# Patient Record
Sex: Female | Born: 1945 | Race: White | Hispanic: No | Marital: Married | Smoking: Never smoker
Health system: Southern US, Community
[De-identification: ages and names within clinical notes are randomized; demographics above are authoritative.]

## PROBLEM LIST (undated history)

## (undated) DIAGNOSIS — N952 Postmenopausal atrophic vaginitis: Secondary | ICD-10-CM

## (undated) DIAGNOSIS — M1990 Unspecified osteoarthritis, unspecified site: Secondary | ICD-10-CM

## (undated) DIAGNOSIS — K219 Gastro-esophageal reflux disease without esophagitis: Secondary | ICD-10-CM

## (undated) DIAGNOSIS — R059 Cough, unspecified: Secondary | ICD-10-CM

## (undated) DIAGNOSIS — R05 Cough: Secondary | ICD-10-CM

## (undated) DIAGNOSIS — K589 Irritable bowel syndrome without diarrhea: Secondary | ICD-10-CM

## (undated) DIAGNOSIS — J189 Pneumonia, unspecified organism: Secondary | ICD-10-CM

## (undated) DIAGNOSIS — K5730 Diverticulosis of large intestine without perforation or abscess without bleeding: Secondary | ICD-10-CM

## (undated) DIAGNOSIS — D649 Anemia, unspecified: Secondary | ICD-10-CM

## (undated) DIAGNOSIS — M8580 Other specified disorders of bone density and structure, unspecified site: Secondary | ICD-10-CM

## (undated) HISTORY — PX: COLONOSCOPY: SHX174

## (undated) HISTORY — DX: Irritable bowel syndrome without diarrhea: K58.9

## (undated) HISTORY — DX: Unspecified osteoarthritis, unspecified site: M19.90

## (undated) HISTORY — DX: Postmenopausal atrophic vaginitis: N95.2

## (undated) HISTORY — DX: Diverticulosis of large intestine without perforation or abscess without bleeding: K57.30

## (undated) HISTORY — DX: Gastro-esophageal reflux disease without esophagitis: K21.9

## (undated) HISTORY — DX: Pneumonia, unspecified organism: J18.9

## (undated) HISTORY — DX: Anemia, unspecified: D64.9

## (undated) HISTORY — DX: Cough: R05

## (undated) HISTORY — DX: Cough, unspecified: R05.9

## (undated) HISTORY — DX: Other specified disorders of bone density and structure, unspecified site: M85.80

---

## 1978-02-17 HISTORY — PX: TUBAL LIGATION: SHX77

## 1997-08-12 ENCOUNTER — Other Ambulatory Visit: Admission: RE | Admit: 1997-08-12 | Discharge: 1997-08-12 | Payer: Self-pay | Admitting: *Deleted

## 1998-09-12 ENCOUNTER — Other Ambulatory Visit: Admission: RE | Admit: 1998-09-12 | Discharge: 1998-09-12 | Payer: Self-pay | Admitting: *Deleted

## 1999-10-05 ENCOUNTER — Other Ambulatory Visit: Admission: RE | Admit: 1999-10-05 | Discharge: 1999-10-05 | Payer: Self-pay | Admitting: *Deleted

## 2000-10-10 ENCOUNTER — Other Ambulatory Visit: Admission: RE | Admit: 2000-10-10 | Discharge: 2000-10-10 | Payer: Self-pay | Admitting: *Deleted

## 2002-04-19 DIAGNOSIS — K5730 Diverticulosis of large intestine without perforation or abscess without bleeding: Secondary | ICD-10-CM

## 2002-04-19 HISTORY — DX: Diverticulosis of large intestine without perforation or abscess without bleeding: K57.30

## 2003-08-21 ENCOUNTER — Other Ambulatory Visit: Admission: RE | Admit: 2003-08-21 | Discharge: 2003-08-21 | Payer: Self-pay | Admitting: Obstetrics and Gynecology

## 2005-04-02 ENCOUNTER — Other Ambulatory Visit: Admission: RE | Admit: 2005-04-02 | Discharge: 2005-04-02 | Payer: Self-pay | Admitting: Internal Medicine

## 2009-10-22 ENCOUNTER — Encounter (INDEPENDENT_AMBULATORY_CARE_PROVIDER_SITE_OTHER): Payer: Self-pay | Admitting: *Deleted

## 2010-05-19 NOTE — Letter (Signed)
Summary: Colonoscopy Date Change Letter  [facility]  [address] [place], [place] [address]   Phone: [phone]  Fax: [phone]      October 22, 2009 MRN: [id number]   Janice Flynn [address] [place], [place]  [address]   Dear Ms. Kalbfleisch,   Previously you were recommended to have a repeat colonoscopy around this time. Your chart was recently reviewed by Dr. Hedwig Morton. Juanda Chance of [facility]. Follow up colonoscopy is now recommended in August 2014. This revised recommendation is based on current, nationally recognized guidelines for colorectal cancer screening and polyp surveillance. These guidelines are endorsed by the American Cancer Society, [employer] on Colorectal Cancer as well as numerous other major medical organizations.  Please understand that our recommendation assumes that you do not have any new symptoms such as bleeding, a change in bowel habits, anemia, or significant abdominal discomfort. If you do have any concerning GI symptoms or want to discuss the guideline recommendations, please call to arrange an office visit at your earliest convenience. Otherwise we will keep you in our reminder system and contact you 1-2 months prior to the date listed above to schedule your next colonoscopy.  Thank you,  Hedwig Morton. Juanda Chance, M.D.  [facility] [phone]

## 2010-07-06 ENCOUNTER — Ambulatory Visit (INDEPENDENT_AMBULATORY_CARE_PROVIDER_SITE_OTHER): Payer: PRIVATE HEALTH INSURANCE | Admitting: Internal Medicine

## 2010-07-06 DIAGNOSIS — M899 Disorder of bone, unspecified: Secondary | ICD-10-CM

## 2010-07-06 DIAGNOSIS — R350 Frequency of micturition: Secondary | ICD-10-CM

## 2010-07-06 DIAGNOSIS — J40 Bronchitis, not specified as acute or chronic: Secondary | ICD-10-CM

## 2010-07-06 DIAGNOSIS — M949 Disorder of cartilage, unspecified: Secondary | ICD-10-CM

## 2010-08-20 ENCOUNTER — Ambulatory Visit (INDEPENDENT_AMBULATORY_CARE_PROVIDER_SITE_OTHER): Payer: PRIVATE HEALTH INSURANCE | Admitting: Internal Medicine

## 2010-08-20 ENCOUNTER — Other Ambulatory Visit: Payer: Self-pay

## 2010-08-20 DIAGNOSIS — D386 Neoplasm of uncertain behavior of respiratory organ, unspecified: Secondary | ICD-10-CM

## 2010-08-20 DIAGNOSIS — K5732 Diverticulitis of large intestine without perforation or abscess without bleeding: Secondary | ICD-10-CM

## 2010-08-20 DIAGNOSIS — Z1272 Encounter for screening for malignant neoplasm of vagina: Secondary | ICD-10-CM

## 2010-08-20 DIAGNOSIS — M899 Disorder of bone, unspecified: Secondary | ICD-10-CM

## 2010-08-20 DIAGNOSIS — Z23 Encounter for immunization: Secondary | ICD-10-CM

## 2010-08-20 DIAGNOSIS — Z01419 Encounter for gynecological examination (general) (routine) without abnormal findings: Secondary | ICD-10-CM

## 2010-09-02 ENCOUNTER — Encounter: Payer: Self-pay | Admitting: Internal Medicine

## 2011-02-22 ENCOUNTER — Encounter: Payer: Self-pay | Admitting: Internal Medicine

## 2011-02-22 ENCOUNTER — Telehealth: Payer: Self-pay | Admitting: Internal Medicine

## 2011-02-22 DIAGNOSIS — M8580 Other specified disorders of bone density and structure, unspecified site: Secondary | ICD-10-CM | POA: Insufficient documentation

## 2011-02-22 NOTE — Telephone Encounter (Signed)
Please call pt an tell her that her bone scan shows early bone thinning - osteopenia.   Take calcium 1200-1500mg  and Vit D [phone] units

## 2011-02-22 NOTE — Telephone Encounter (Signed)
LMOVM for pt to return call to the office

## 2011-02-24 ENCOUNTER — Telehealth: Payer: Self-pay | Admitting: Emergency Medicine

## 2011-02-24 NOTE — Telephone Encounter (Signed)
Yes she should have flu and Tdap

## 2011-02-24 NOTE — Telephone Encounter (Signed)
Spoke with Janice Flynn this morning.  Answered questions regarding flu shot, she will come in for injection.  She is up to date on all other immunizations except pertussis.  She last had tetanus in 2005, but is uncertain about pertussis.  She does have small grandchildren ranging in age from 91-66 years old.  She would like to know if she needs to update herself on Tdap immunization and if so, can she do that while she is here for her flu shot?

## 2011-02-24 NOTE — Telephone Encounter (Signed)
Spoke with Janice Flynn, she is agreeable to flu and Tdap.  Appt scheduled for tomorrow at 1130am for nurse visit for immunizations

## 2011-02-24 NOTE — Telephone Encounter (Signed)
Spoke with Britta Mccreedy, she is aware of results and agreeable to calcium and vitamin D recommendations

## 2011-02-25 ENCOUNTER — Ambulatory Visit (INDEPENDENT_AMBULATORY_CARE_PROVIDER_SITE_OTHER): Payer: Medicare Other | Admitting: Emergency Medicine

## 2011-02-25 DIAGNOSIS — Z23 Encounter for immunization: Secondary | ICD-10-CM

## 2011-05-27 ENCOUNTER — Other Ambulatory Visit: Payer: Self-pay | Admitting: Dermatology

## 2011-09-30 ENCOUNTER — Encounter: Payer: Self-pay | Admitting: Internal Medicine

## 2011-09-30 ENCOUNTER — Encounter: Payer: Medicare Other | Admitting: Internal Medicine

## 2011-09-30 ENCOUNTER — Ambulatory Visit (INDEPENDENT_AMBULATORY_CARE_PROVIDER_SITE_OTHER): Payer: Medicare Other | Admitting: Internal Medicine

## 2011-09-30 VITALS — BP 128/76 | HR 96 | Temp 98.8°F | Resp 16 | Ht 63.0 in | Wt 136.0 lb

## 2011-09-30 DIAGNOSIS — M1990 Unspecified osteoarthritis, unspecified site: Secondary | ICD-10-CM | POA: Insufficient documentation

## 2011-09-30 DIAGNOSIS — J309 Allergic rhinitis, unspecified: Secondary | ICD-10-CM

## 2011-09-30 DIAGNOSIS — J310 Chronic rhinitis: Secondary | ICD-10-CM | POA: Insufficient documentation

## 2011-09-30 DIAGNOSIS — Z0000 Encounter for general adult medical examination without abnormal findings: Secondary | ICD-10-CM

## 2011-09-30 DIAGNOSIS — K5730 Diverticulosis of large intestine without perforation or abscess without bleeding: Secondary | ICD-10-CM

## 2011-09-30 DIAGNOSIS — G4700 Insomnia, unspecified: Secondary | ICD-10-CM | POA: Insufficient documentation

## 2011-09-30 DIAGNOSIS — K5790 Diverticulosis of intestine, part unspecified, without perforation or abscess without bleeding: Secondary | ICD-10-CM | POA: Insufficient documentation

## 2011-09-30 DIAGNOSIS — Z862 Personal history of diseases of the blood and blood-forming organs and certain disorders involving the immune mechanism: Secondary | ICD-10-CM

## 2011-09-30 DIAGNOSIS — M8580 Other specified disorders of bone density and structure, unspecified site: Secondary | ICD-10-CM

## 2011-09-30 DIAGNOSIS — M899 Disorder of bone, unspecified: Secondary | ICD-10-CM

## 2011-09-30 LAB — COMPREHENSIVE METABOLIC PANEL
CO2: 28 mEq/L (ref 19–32)
Creat: 0.85 mg/dL (ref 0.50–1.10)
Glucose, Bld: 100 mg/dL — ABNORMAL HIGH (ref 70–99)
Total Bilirubin: 0.4 mg/dL (ref 0.3–1.2)

## 2011-09-30 LAB — LIPID PANEL
Cholesterol: 169 mg/dL (ref 0–200)
Total CHOL/HDL Ratio: 3.4 Ratio
Triglycerides: 82 mg/dL (ref <150)
VLDL: 16 mg/dL (ref 0–40)

## 2011-09-30 LAB — CBC WITH DIFFERENTIAL/PLATELET
Eosinophils Absolute: 0 10*3/uL (ref 0.0–0.7)
Eosinophils Relative: 1 % (ref 0–5)
Lymphs Abs: 1.2 10*3/uL (ref 0.7–4.0)
MCH: 27.5 pg (ref 26.0–34.0)
MCHC: 34.4 g/dL (ref 30.0–36.0)
MCV: 80.1 fL (ref 78.0–100.0)
Monocytes Relative: 9 % (ref 3–12)
Platelets: 218 10*3/uL (ref 150–400)
RBC: 4.47 MIL/uL (ref 3.87–5.11)

## 2011-09-30 LAB — POCT URINALYSIS DIPSTICK
Blood, UA: NEGATIVE
Glucose, UA: NEGATIVE
Nitrite, UA: NEGATIVE
Protein, UA: NEGATIVE
Spec Grav, UA: 1.01
Urobilinogen, UA: 0.2

## 2011-09-30 MED ORDER — LORAZEPAM 0.5 MG PO TABS: ORAL_TABLET | ORAL | Status: DC

## 2011-09-30 NOTE — Patient Instructions (Addendum)
Labs will be mailed to you 

## 2011-09-30 NOTE — Progress Notes (Signed)
Subjective:    Patient ID: Janice Flynn, female    DOB: [birth year], 66 y.o.   MRN: [id number]  HPI  Janice Flynn is here for comprehensive eval. Overall doing well, but has insomnia.  Pt has had a lot of stress in life and mother died a few months ago, she is having reconstruction on house.  Falls asleep but wakes up each night.  Used Ambien in the past  She is due for colonsocopy next year and mammogram in the fall. UTD with vaccines  Allergies  Allergen Reactions  . Augmentin (Amoxicillin-Pot Clavulanate) Diarrhea   Past Medical History  Diagnosis Date  . Osteopenia   . Anemia   . Diverticulosis of colon 2004  . Allergic rhinitis   . Arthritis   . Atrophic vaginitis    Past Surgical History  Procedure Date  . Tubal ligation    History   Social History  . Marital Status: Married    Spouse Name: Linwood    Number of Children: N/A  . Years of Education: 80yr clg   Occupational History  . BOOK KEEPER    Social History Main Topics  . Smoking status: Never Smoker   . Smokeless tobacco: Never Used  . Alcohol Use: 1.0 oz/week    2 drink(s) per week     2-3 glasses of wine weekly  . Drug Use: No  . Sexually Active: Yes -- Female partner(s)   Other Topics Concern  . Not on file   Social History Narrative  . No narrative on file   Family History  Problem Relation Age of Onset  . COPD Father     deceased  . Alzheimer's disease Mother   . Osteoporosis Mother   . Ovarian cancer Maternal Grandmother   . Colon cancer Paternal Grandmother    Patient Active Problem List  Diagnosis  . Osteopenia  . History of anemia  . Diverticulosis  . Allergic rhinitis  . DJD (degenerative joint disease)  . Atrophic rhinitis  . Insomnia   Current Outpatient Prescriptions on File Prior to Visit  Medication Sig Dispense Refill  . calcium carbonate 200 MG capsule Take 250 mg by mouth as needed.        . Cholecalciferol (VITAMIN D) 1000 UNITS capsule Take 1,000 Units by mouth daily.         Marland Kitchen estradiol (ESTRACE) 0.1 MG/GM vaginal cream Place 2 g vaginally 2 (two) times a week.            Review of Systems  Constitutional: Negative.   HENT: Negative.   Eyes: Negative.   Respiratory: Negative.   Cardiovascular: Negative.   Gastrointestinal: Negative.   Genitourinary: Negative.   Musculoskeletal: Negative.   Skin: Negative.   Neurological: Negative.   Hematological: Negative.   Psychiatric/Behavioral: Positive for disturbed wake/sleep cycle.       Objective:   Physical Exam Physical Exam  Nursing note and vitals reviewed.  Constitutional: She is oriented to person, place, and time. She appears well-developed and well-nourished.  HENT:  Head: Normocephalic and atraumatic.  Right Ear: Tympanic membrane and ear canal normal. No drainage. Tympanic membrane is not injected and not erythematous.  Left Ear: Tympanic membrane and ear canal normal. No drainage. Tympanic membrane is not injected and not erythematous.  Nose: Nose normal. Right sinus exhibits no maxillary sinus tenderness and no frontal sinus tenderness. Left sinus exhibits no maxillary sinus tenderness and no frontal sinus tenderness.  Mouth/Throat: Oropharynx is clear and moist. No oral lesions. No  oropharyngeal exudate.  Eyes: Conjunctivae and EOM are normal. Pupils are equal, round, and reactive to light.  Neck: Normal range of motion. Neck supple. No JVD present. Carotid bruit is not present. No mass and no thyromegaly present.  Cardiovascular: Normal rate, regular rhythm, S1 normal, S2 normal and intact distal pulses. Exam reveals no gallop and no friction rub.  No murmur heard.  Pulses:  Carotid pulses are 2+ on the right side, and 2+ on the left side.  Dorsalis pedis pulses are 2+ on the right side, and 2+ on the left side.  No carotid bruit. No LE edema  Pulmonary/Chest: Breath sounds normal. She has no wheezes. She has no rales. She exhibits no tenderness. Breasts no discrete masses no nipple  discharge no axillary adenopathy bilaterally. Abdominal: Soft. Bowel sounds are normal. She exhibits no distension and no mass. There is no hepatosplenomegaly. There is no tenderness. There is no CVA tenderness. Pelvic  bimanua only no adnexal masses  Recatal no masses guaic neg Musculoskeletal: Normal range of motion.  No active synovitis to joints.  Lymphadenopathy:  She has no cervical adenopathy.  She has no axillary adenopathy.  Right: No inguinal and no supraclavicular adenopathy present.  Left: No inguinal and no supraclavicular adenopathy present.  Neurological: She is alert and oriented to person, place, and time. She has normal strength and normal reflexes. She displays no tremor. No cranial nerve deficit or sensory deficit. Coordination and gait normal.  Skin: Skin is warm and dry. No rash noted. No cyanosis. Nails show no clubbing.  Psychiatric: She has a normal mood and affect. Her speech is normal and behavior is normal. Cognition and memory are normal.           Assessment & Plan:

## 2011-10-04 ENCOUNTER — Telehealth: Payer: Self-pay | Admitting: *Deleted

## 2011-10-04 NOTE — Telephone Encounter (Signed)
Copy of Pt's labs mailed to her home address.

## 2012-03-06 ENCOUNTER — Ambulatory Visit (INDEPENDENT_AMBULATORY_CARE_PROVIDER_SITE_OTHER): Payer: Medicare Other | Admitting: *Deleted

## 2012-03-06 DIAGNOSIS — Z23 Encounter for immunization: Secondary | ICD-10-CM

## 2012-03-08 ENCOUNTER — Encounter: Payer: Self-pay | Admitting: *Deleted

## 2012-06-08 ENCOUNTER — Other Ambulatory Visit: Payer: Self-pay | Admitting: Dermatology

## 2012-11-10 ENCOUNTER — Encounter: Payer: Self-pay | Admitting: Internal Medicine

## 2012-11-14 ENCOUNTER — Ambulatory Visit (INDEPENDENT_AMBULATORY_CARE_PROVIDER_SITE_OTHER): Payer: Medicare Other | Admitting: Internal Medicine

## 2012-11-14 ENCOUNTER — Encounter: Payer: Self-pay | Admitting: Internal Medicine

## 2012-11-14 ENCOUNTER — Telehealth: Payer: Self-pay | Admitting: *Deleted

## 2012-11-14 ENCOUNTER — Ambulatory Visit (HOSPITAL_BASED_OUTPATIENT_CLINIC_OR_DEPARTMENT_OTHER)
Admission: RE | Admit: 2012-11-14 | Discharge: 2012-11-14 | Disposition: A | Discharge status: Home / Self Care | Payer: Medicare Other | Source: Ambulatory Visit | Attending: Internal Medicine | Admitting: Internal Medicine

## 2012-11-14 VITALS — BP 110/67 | HR 81 | Temp 97.5°F | Resp 18 | Wt 137.0 lb

## 2012-11-14 DIAGNOSIS — Z862 Personal history of diseases of the blood and blood-forming organs and certain disorders involving the immune mechanism: Secondary | ICD-10-CM

## 2012-11-14 DIAGNOSIS — K5730 Diverticulosis of large intestine without perforation or abscess without bleeding: Secondary | ICD-10-CM

## 2012-11-14 DIAGNOSIS — M8580 Other specified disorders of bone density and structure, unspecified site: Secondary | ICD-10-CM

## 2012-11-14 DIAGNOSIS — R059 Cough, unspecified: Secondary | ICD-10-CM

## 2012-11-14 DIAGNOSIS — Z0000 Encounter for general adult medical examination without abnormal findings: Secondary | ICD-10-CM

## 2012-11-14 DIAGNOSIS — N3941 Urge incontinence: Secondary | ICD-10-CM

## 2012-11-14 DIAGNOSIS — Z139 Encounter for screening, unspecified: Secondary | ICD-10-CM

## 2012-11-14 DIAGNOSIS — M899 Disorder of bone, unspecified: Secondary | ICD-10-CM

## 2012-11-14 DIAGNOSIS — K5790 Diverticulosis of intestine, part unspecified, without perforation or abscess without bleeding: Secondary | ICD-10-CM

## 2012-11-14 DIAGNOSIS — R05 Cough: Secondary | ICD-10-CM

## 2012-11-14 DIAGNOSIS — J309 Allergic rhinitis, unspecified: Secondary | ICD-10-CM

## 2012-11-14 LAB — CBC WITH DIFFERENTIAL/PLATELET
Basophils Absolute: 0 10*3/uL (ref 0.0–0.1)
Basophils Relative: 0 % (ref 0–1)
Eosinophils Relative: 1 % (ref 0–5)
HCT: 37.4 % (ref 36.0–46.0)
MCHC: 34.8 g/dL (ref 30.0–36.0)
MCV: 80.8 fL (ref 78.0–100.0)
Monocytes Absolute: 0.6 10*3/uL (ref 0.1–1.0)
RDW: 14.1 % (ref 11.5–15.5)

## 2012-11-14 LAB — COMPREHENSIVE METABOLIC PANEL
AST: 19 U/L (ref 0–37)
Alkaline Phosphatase: 79 U/L (ref 39–117)
BUN: 12 mg/dL (ref 6–23)
Calcium: 9.3 mg/dL (ref 8.4–10.5)
Chloride: 104 mEq/L (ref 96–112)
Creat: 0.85 mg/dL (ref 0.50–1.10)

## 2012-11-14 MED ORDER — PREDNISONE 20 MG PO TABS: ORAL_TABLET | ORAL | Status: DC

## 2012-11-14 MED ORDER — FLUTICASONE PROPIONATE 50 MCG/ACT NA SUSP: 2.0000 | Freq: Every day | NASAL | Status: DC

## 2012-11-14 MED ORDER — OMEPRAZOLE 20 MG PO CPDR: 20.0000 mg | DELAYED_RELEASE_CAPSULE | Freq: Every day | ORAL | Status: DC

## 2012-11-14 MED ORDER — LORAZEPAM 1 MG PO TABS: ORAL_TABLET | ORAL | Status: DC

## 2012-11-14 NOTE — Telephone Encounter (Signed)
Notified pt of chest xray and an appt made

## 2012-11-14 NOTE — Telephone Encounter (Signed)
Message copied by Mathews Robinsons on Tue Nov 14, 2012  2:49 PM ------      Message from: Raechel Chute D      Created: Tue Nov 14, 2012  2:38 PM       Call pt and let her know that her xray shows she may have pneumonia              Have her see me tomorrow I want to give her an antibiotic injection ------

## 2012-11-14 NOTE — Progress Notes (Signed)
Subjective:    Patient ID: Janice Flynn, female    DOB: 1946/01/05, 67 y.o.   MRN: [id number]  HPI  [name] is here for CPE  Cough   She reports chronic cough that has been present for several months no wheezing no chest pain no SOB.  She does have intermittant chest burning from " acid backing up"  She is not on a PPI.   Insomnia  She reports Ambien and Ativan has worked in the past  GERD  See above  No change in color of stool  She also reports urge incontinence  No dysuria    She is hesitant to start on meds.    Allergies  Allergen Reactions  . Augmentin (Amoxicillin-Pot Clavulanate) Diarrhea   Past Medical History  Diagnosis Date  . Osteopenia   . Anemia   . Diverticulosis of colon 2004  . Allergic rhinitis   . Arthritis   . Atrophic vaginitis    Past Surgical History  Procedure Laterality Date  . Tubal ligation     History   Social History  . Marital Status: Married    Spouse Name: Linwood    Number of Children: N/A  . Years of Education: 23yr clg   Occupational History  . BOOK KEEPER    Social History Main Topics  . Smoking status: Never Smoker   . Smokeless tobacco: Never Used  . Alcohol Use: 1.0 oz/week    2 drink(s) per week     Comment: 2-3 glasses of wine weekly  . Drug Use: No  . Sexually Active: Yes -- Female partner(s)   Other Topics Concern  . Not on file   Social History Narrative  . No narrative on file   Family History  Problem Relation Age of Onset  . COPD Father     deceased  . Alzheimer's disease Mother   . Osteoporosis Mother   . Ovarian cancer Maternal Grandmother   . Colon cancer Paternal Grandmother    Patient Active Problem List   Diagnosis Date Noted  . Urge incontinence 11/14/2012  . Cough 11/14/2012  . History of anemia 09/30/2011  . Diverticulosis 09/30/2011  . Allergic rhinitis 09/30/2011  . DJD (degenerative joint disease) 09/30/2011  . Atrophic rhinitis 09/30/2011  . Insomnia 09/30/2011  . Osteopenia  02/22/2011   Current Outpatient Prescriptions on File Prior to Visit  Medication Sig Dispense Refill  . Cholecalciferol (VITAMIN D) 1000 UNITS capsule Take 1,000 Units by mouth daily.        Marland Kitchen estradiol (ESTRACE) 0.1 MG/GM vaginal cream Place 2 g vaginally 2 (two) times a week.        . calcium carbonate 200 MG capsule Take 250 mg by mouth as needed.         No current facility-administered medications on file prior to visit.      Review of Systems  Respiratory: Positive for cough.   Genitourinary: Positive for urgency.  All other systems reviewed and are negative.       Objective:   Physical Exam Physical Exam  Nursing note and vitals reviewed.  Constitutional: She is oriented to person, place, and time. She appears well-developed and well-nourished.  HENT:  Head: Normocephalic and atraumatic.  Right Ear: Tympanic membrane and ear canal normal. No drainage. Tympanic membrane is not injected and not erythematous.  Left Ear: Tympanic membrane and ear canal normal. No drainage. Tympanic membrane is not injected and not erythematous.  Nose: Nose normal. Right sinus exhibits no maxillary sinus  tenderness and no frontal sinus tenderness. Left sinus exhibits no maxillary sinus tenderness and no frontal sinus tenderness.  Mouth/Throat: Oropharynx is clear and moist. No oral lesions. No oropharyngeal exudate.  Eyes: Conjunctivae and EOM are normal. Pupils are equal, round, and reactive to light.  Neck: Normal range of motion. Neck supple. No JVD present. Carotid bruit is not present. No mass and no thyromegaly present.  Cardiovascular: Normal rate, regular rhythm, S1 normal, S2 normal and intact distal pulses. Exam reveals no gallop and no friction rub.  No murmur heard.  Pulses:  Carotid pulses are 2+ on the right side, and 2+ on the left side.  Dorsalis pedis pulses are 2+ on the right side, and 2+ on the left side.  No carotid bruit. No LE edema  Pulmonary/Chest: Breath sounds  normal. She has no wheezes. She has no rales. She exhibits no tenderness.  Abdominal: Soft. Bowel sounds are normal. She exhibits no distension and no mass. There is no hepatosplenomegaly. There is no tenderness. There is no CVA tenderness.  Musculoskeletal: Normal range of motion.  No active synovitis to joints.  Lymphadenopathy:  She has no cervical adenopathy.  She has no axillary adenopathy.  Right: No inguinal and no supraclavicular adenopathy present.  Left: No inguinal and no supraclavicular adenopathy present.  Neurological: She is alert and oriented to person, place, and time. She has normal strength and normal reflexes. She displays no tremor. No cranial nerve deficit or sensory deficit. Coordination and gait normal.  Skin: Skin is warm and dry. No rash noted. No cyanosis. Nails show no clubbing.  Psychiatric: She has a normal mood and affect. Her speech is normal and behavior is normal. Cognition and memory are normal.           Assessment & Plan:  Health maintenance :  Due for MM and DExa in October [employer]. ADvised that she is due for her colonoscopy this year with Dr. Pernell Dupre today  Allergic rhinitis  flonase daily until I see her again  Cough  Will give short course of Prednisone 60 mg taper over 9 days.  Get CXR today  I do not hear bronchospasm on exam  GERD   Will start on omepraxole 20 mg daily  Urge incontinence:  She does not wish meds now,  I offered referral to pelvic floor PT but she wishes to think about this    Insomnia  Ok for Ativan 1 mg 2 times weekly hs  Ostoepenia  Calcium vitamin D  See me in 4-6 weeks

## 2012-11-14 NOTE — Patient Instructions (Addendum)
Take prednisone as directed  Use flonase 2 sprays in to each nostril daily  Take prilosec 20 mg daily  See me in 4-6 weeks

## 2012-11-15 ENCOUNTER — Ambulatory Visit (INDEPENDENT_AMBULATORY_CARE_PROVIDER_SITE_OTHER): Payer: Medicare Other | Admitting: Internal Medicine

## 2012-11-15 ENCOUNTER — Encounter: Payer: Self-pay | Admitting: Internal Medicine

## 2012-11-15 VITALS — BP 103/64 | HR 77 | Temp 98.0°F | Resp 18

## 2012-11-15 DIAGNOSIS — J189 Pneumonia, unspecified organism: Secondary | ICD-10-CM

## 2012-11-15 DIAGNOSIS — R059 Cough, unspecified: Secondary | ICD-10-CM

## 2012-11-15 DIAGNOSIS — R05 Cough: Secondary | ICD-10-CM

## 2012-11-15 MED ORDER — AZITHROMYCIN 250 MG PO TABS: ORAL_TABLET | ORAL | Status: DC

## 2012-11-15 MED ORDER — CEFTRIAXONE SODIUM 1 G IJ SOLR
1.0000 g | Freq: Once | INTRAMUSCULAR | Status: AC
  Administered 2012-11-15: 1 g via INTRAMUSCULAR

## 2012-11-15 NOTE — Patient Instructions (Addendum)
See me in 8 weeks  

## 2012-11-15 NOTE — Progress Notes (Signed)
Subjective:    Patient ID: Janice Flynn, female    DOB: [birth year], 67 y.o.   MRN: [id number]  HPI Janice Flynn is here for follow up.  See cxr  Indicative of air space disease and possible pneumonia.  Janice Flynn does state that she had [place] prior to her [place] trip and feels like she cannot get a deep breathe.  No fever cough occasionally productive of white sputum .   She does not like to take cough medicine but would like to take the prednisone  Allergies  Allergen Reactions  . Augmentin (Amoxicillin-Pot Clavulanate) Diarrhea   Past Medical History  Diagnosis Date  . Osteopenia   . Anemia   . Diverticulosis of colon 2004  . Allergic rhinitis   . Arthritis   . Atrophic vaginitis    Past Surgical History  Procedure Laterality Date  . Tubal ligation     History   Social History  . Marital Status: Married    Spouse Name: Linwood    Number of Children: N/A  . 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 Smoking status: Never Smoker   . Smokeless tobacco: Never Used  . Alcohol Use: 1.0 oz/week    2 drink(s) per week     Comment: 2-3 glasses of wine weekly  . Drug Use: No  . Sexually Active: Yes -- Female partner(s)   Other Topics Concern  . Not on file   Social History Narrative  . No narrative on file   Family History  Problem Relation Age of Onset  . COPD Father     deceased  . Alzheimer's disease Mother   . Osteoporosis Mother   . Ovarian cancer Maternal Grandmother   . Colon cancer Paternal Grandmother    Patient Active Problem List   Diagnosis Date Noted  . Pneumonia 11/15/2012  . Urge incontinence 11/14/2012  . Cough 11/14/2012  . History of anemia 09/30/2011  . Diverticulosis 09/30/2011  . Allergic rhinitis 09/30/2011  . DJD (degenerative joint disease) 09/30/2011  . Atrophic rhinitis 09/30/2011  . Insomnia 09/30/2011  . Osteopenia 02/22/2011   Current Outpatient Prescriptions on File  Prior to Visit  Medication Sig Dispense Refill  . calcium carbonate 200 MG capsule Take 250 mg by mouth as needed.        . Cholecalciferol (VITAMIN D) 1000 UNITS capsule Take 1,000 Units by mouth daily.        Marland Kitchen estradiol (ESTRACE) 0.1 MG/GM vaginal cream Place 2 g vaginally 2 (two) times a week.        . fluticasone (FLONASE) 50 MCG/ACT nasal spray Place 2 sprays into the nose daily.  16 g  0  . omeprazole (PRILOSEC) 20 MG capsule Take 1 capsule (20 mg total) by mouth daily.  90 capsule  0  . predniSONE (DELTASONE) 20 MG tablet Take 3 tablets for 2 days then 2 tablets for 3 days then 1 tablet for 3 days then stop  18 tablet  0  . LORazepam (ATIVAN) 1 MG tablet Take 1/2 tablet at hs 2 times weekly.  May repeat in 30 mins one time only  20 tablet  1   No current facility-administered medications on file prior to visit.       Review of Systems See HPI    Objective:   Physical Exam  Physical Exam  Nursing note and vitals reviewed.  Constitutional: She is oriented to person, place, and  time. She appears well-developed and well-nourished.  HENT:  Head: Normocephalic and atraumatic.  Cardiovascular: Normal rate and regular rhythm. Exam reveals no gallop and no friction rub.  No murmur heard.  Pulmonary/Chest: Breath sounds normal. She has no wheezes. She has no rales.  Neurological: She is alert and oriented to person, place, and time.  Skin: Skin is warm and dry.  Psychiatric: She has a normal mood and affect. Her behavior is normal.            Assessment & Plan:  RML pneumonia  Will give Rocephin 1gm in office today  And Z-pak  She does not want cough med oK to take prednisone  But will reduce dose to 40 mg for 2 days then 20 mg for 2 days then stop Advised to see me in 8 weeks and that she would need  Repeat CXR to assure clearing  Cough  See above

## 2012-11-15 NOTE — Addendum Note (Signed)
Addended by: Mathews Robinsons on: 11/15/2012 11:26 AM   Modules accepted: Orders

## 2012-11-16 ENCOUNTER — Encounter: Payer: Self-pay | Admitting: *Deleted

## 2012-12-19 ENCOUNTER — Ambulatory Visit: Payer: Medicare Other | Admitting: Internal Medicine

## 2012-12-20 ENCOUNTER — Encounter: Payer: Self-pay | Admitting: Internal Medicine

## 2013-01-09 ENCOUNTER — Ambulatory Visit (INDEPENDENT_AMBULATORY_CARE_PROVIDER_SITE_OTHER): Payer: Medicare Other | Admitting: Internal Medicine

## 2013-01-09 ENCOUNTER — Encounter: Payer: Self-pay | Admitting: Internal Medicine

## 2013-01-09 ENCOUNTER — Ambulatory Visit (HOSPITAL_BASED_OUTPATIENT_CLINIC_OR_DEPARTMENT_OTHER)
Admission: RE | Admit: 2013-01-09 | Discharge: 2013-01-09 | Disposition: A | Discharge status: Home / Self Care | Payer: Medicare Other | Source: Ambulatory Visit | Attending: Internal Medicine | Admitting: Internal Medicine

## 2013-01-09 VITALS — BP 117/72 | HR 82 | Temp 97.3°F | Resp 16

## 2013-01-09 DIAGNOSIS — Z23 Encounter for immunization: Secondary | ICD-10-CM

## 2013-01-09 DIAGNOSIS — K219 Gastro-esophageal reflux disease without esophagitis: Secondary | ICD-10-CM

## 2013-01-09 DIAGNOSIS — Z8701 Personal history of pneumonia (recurrent): Secondary | ICD-10-CM | POA: Insufficient documentation

## 2013-01-09 DIAGNOSIS — J189 Pneumonia, unspecified organism: Secondary | ICD-10-CM

## 2013-01-09 DIAGNOSIS — M899 Disorder of bone, unspecified: Secondary | ICD-10-CM

## 2013-01-09 DIAGNOSIS — Z862 Personal history of diseases of the blood and blood-forming organs and certain disorders involving the immune mechanism: Secondary | ICD-10-CM

## 2013-01-09 DIAGNOSIS — M8580 Other specified disorders of bone density and structure, unspecified site: Secondary | ICD-10-CM

## 2013-01-09 DIAGNOSIS — K5790 Diverticulosis of intestine, part unspecified, without perforation or abscess without bleeding: Secondary | ICD-10-CM

## 2013-01-09 DIAGNOSIS — K5730 Diverticulosis of large intestine without perforation or abscess without bleeding: Secondary | ICD-10-CM

## 2013-01-09 DIAGNOSIS — Z09 Encounter for follow-up examination after completed treatment for conditions other than malignant neoplasm: Secondary | ICD-10-CM | POA: Insufficient documentation

## 2013-01-09 DIAGNOSIS — J309 Allergic rhinitis, unspecified: Secondary | ICD-10-CM

## 2013-01-09 LAB — LIPID PANEL
HDL: 57 mg/dL (ref >39)
Triglycerides: 74 mg/dL (ref <150)

## 2013-01-09 MED ORDER — OMEPRAZOLE 20 MG PO CPDR: 20.0000 mg | DELAYED_RELEASE_CAPSULE | Freq: Every day | ORAL | Status: DC

## 2013-01-09 MED ORDER — OMEPRAZOLE 20 MG PO CPDR: DELAYED_RELEASE_CAPSULE | ORAL | Status: DC

## 2013-01-09 NOTE — Patient Instructions (Addendum)
CXR today    Call Dr.  Juanda Chance for your heartburn  See me as needed    Take 2 tablets of your Prilosec every day until you see Dr. Juanda Chance

## 2013-01-09 NOTE — Addendum Note (Signed)
Addended by: Mathews Robinsons on: 01/09/2013 02:55 PM   Modules accepted: Orders

## 2013-01-09 NOTE — Progress Notes (Signed)
Subjective:    Patient ID: Janice Flynn, female    DOB: 12-May-1945, 67 y.o.   MRN: [id number]  HPI Shanaya is here for follow up of presumed pneumonia.  CXR suggestive of early pnuemonia  No lingering cough  Doing well with exception of allergies  Flonase helping  She still continue to have heartburn type dyspepsia  On omeprazole 20 mg.  Diarrhea off and on no frank blood or dark stools.  She cannot identify any food offenders.  She is mostly on a lactose free diet  Allergies  Allergen Reactions  . Augmentin [Amoxicillin-Pot Clavulanate] Diarrhea   Past Medical History  Diagnosis Date  . Osteopenia   . Anemia   . Diverticulosis of colon 2004  . Allergic rhinitis   . Arthritis   . Atrophic vaginitis    Past Surgical History  Procedure Laterality Date  . Tubal ligation     History   Social History  . Marital Status: Married    Spouse Name: Linwood    Number of Children: N/A  . Years of Education: 85yr clg   Occupational History  . BOOK KEEPER    Social History Main Topics  . Smoking status: Never Smoker   . Smokeless tobacco: Never Used  . Alcohol Use: 1.0 oz/week    2 drink(s) per week     Comment: 2-3 glasses of wine weekly  . Drug Use: No  . Sexual Activity: Yes    Partners: Male   Other Topics Concern  . Not on file   Social History Narrative  . No narrative on file   Family History  Problem Relation Age of Onset  . COPD Father     deceased  . Alzheimer's disease Mother   . Osteoporosis Mother   . Ovarian cancer Maternal Grandmother   . Colon cancer Paternal Grandmother    Patient Active Problem List   Diagnosis Date Noted  . Pneumonia 11/15/2012  . Urge incontinence 11/14/2012  . Cough 11/14/2012  . History of anemia 09/30/2011  . Diverticulosis 09/30/2011  . Allergic rhinitis 09/30/2011  . DJD (degenerative joint disease) 09/30/2011  . Atrophic rhinitis 09/30/2011  . Insomnia 09/30/2011  . Osteopenia 02/22/2011   Current Outpatient  Prescriptions on File Prior to Visit  Medication Sig Dispense Refill  . azithromycin (ZITHROMAX Z-PAK) 250 MG tablet Take as directed  6 each  0  . calcium carbonate 200 MG capsule Take 250 mg by mouth as needed.        . Cholecalciferol (VITAMIN D) 1000 UNITS capsule Take 1,000 Units by mouth daily.        Marland Kitchen estradiol (ESTRACE) 0.1 MG/GM vaginal cream Place 2 g vaginally 2 (two) times a week.        . fluticasone (FLONASE) 50 MCG/ACT nasal spray Place 2 sprays into the nose daily.  16 g  0  . LORazepam (ATIVAN) 1 MG tablet Take 1/2 tablet at hs 2 times weekly.  May repeat in 30 mins one time only  20 tablet  1  . omeprazole (PRILOSEC) 20 MG capsule Take 1 capsule (20 mg total) by mouth daily.  90 capsule  0  . predniSONE (DELTASONE) 20 MG tablet Take 3 tablets for 2 days then 2 tablets for 3 days then 1 tablet for 3 days then stop  18 tablet  0   No current facility-administered medications on file prior to visit.       Review of Systems See HPI    Objective:  Physical Exam Physical Exam  Nursing note and vitals reviewed.  Constitutional: She is oriented to person, place, and time. She appears well-developed and well-nourished.  HENT:  Head: Normocephalic and atraumatic.  Cardiovascular: Normal rate and regular rhythm. Exam reveals no gallop and no friction rub.  No murmur heard.  Pulmonary/Chest: Breath sounds normal. She has no wheezes. She has no rales.  Neurological: She is alert and oriented to person, place, and time.  Skin: Skin is warm and dry.  Psychiatric: She has a normal mood and affect. Her behavior is normal.             Assessment & Plan:  Pneumonia  Will get follow up CXR  GERD/dyspepsia  Ok to take 40 mg of Prilosec.  I advised pt to make appt with her GI MD as she may need upper endoscopy  Pt voices understanding  Allergic rhinitis  continuye flonase OK to take Claritin plain  See me as needed  Flu vaccine today

## 2013-01-09 NOTE — Addendum Note (Signed)
Addended by: Mathews Robinsons on: 01/09/2013 10:21 AM   Modules accepted: Orders

## 2013-01-11 ENCOUNTER — Ambulatory Visit: Payer: Medicare Other | Admitting: Internal Medicine

## 2013-01-15 ENCOUNTER — Encounter: Payer: Self-pay | Admitting: *Deleted

## 2013-01-15 ENCOUNTER — Telehealth: Payer: Self-pay | Admitting: Internal Medicine

## 2013-01-15 DIAGNOSIS — R9389 Abnormal findings on diagnostic imaging of other specified body structures: Secondary | ICD-10-CM

## 2013-01-15 NOTE — Telephone Encounter (Signed)
Spoke with pt and informed of CXR results will schedule Ct of chest for comparison to 2007

## 2013-01-17 ENCOUNTER — Ambulatory Visit (HOSPITAL_BASED_OUTPATIENT_CLINIC_OR_DEPARTMENT_OTHER)
Admission: RE | Admit: 2013-01-17 | Discharge: 2013-01-17 | Disposition: A | Discharge status: Home / Self Care | Payer: Medicare Other | Source: Ambulatory Visit | Attending: Internal Medicine | Admitting: Internal Medicine

## 2013-01-17 DIAGNOSIS — R918 Other nonspecific abnormal finding of lung field: Secondary | ICD-10-CM | POA: Insufficient documentation

## 2013-01-17 DIAGNOSIS — R9389 Abnormal findings on diagnostic imaging of other specified body structures: Secondary | ICD-10-CM

## 2013-01-18 ENCOUNTER — Telehealth: Payer: Self-pay | Admitting: Internal Medicine

## 2013-01-18 DIAGNOSIS — R918 Other nonspecific abnormal finding of lung field: Secondary | ICD-10-CM

## 2013-01-18 NOTE — Telephone Encounter (Signed)
Spoke with pt and informed of CT results  I spoke with Dr.Young of [facility] and will schedule with their office for  further assessment  and possible bronchoscopy for confirmed diagnosis  Pt not acutely ill .  She is not coughing and will not place on antibiotics at this time

## 2013-01-19 ENCOUNTER — Encounter: Payer: Self-pay | Admitting: Internal Medicine

## 2013-01-19 ENCOUNTER — Ambulatory Visit (INDEPENDENT_AMBULATORY_CARE_PROVIDER_SITE_OTHER): Payer: Medicare Other | Admitting: Internal Medicine

## 2013-01-19 VITALS — BP 116/72 | HR 81 | Temp 98.0°F | Ht 63.25 in | Wt 140.0 lb

## 2013-01-19 DIAGNOSIS — R918 Other nonspecific abnormal finding of lung field: Secondary | ICD-10-CM

## 2013-01-19 MED ORDER — FAMOTIDINE 20 MG PO TABS: ORAL_TABLET | ORAL | Status: DC

## 2013-01-19 NOTE — Assessment & Plan Note (Signed)
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  Much better p rx with abx and prednisone and Although there are clearly abnormalities on CT scan, they should probably be considered "microscopic"  At this point and no need to change rx unless "macroscopic" changes evolve on cxr or her symptoms flare off pred and abx    The RML and Lingula are poorly "designed" and are frequently  Infected/re-infected  with organisms like MAI that are not necessarily significant pathogens and are extremely difficult to irradicate.  Best option here is just follow on a reflux regimen to prevent any acid injury to the airways or parenchyma and f/u with pft's and cxr 's

## 2013-01-19 NOTE — Progress Notes (Signed)
Subjective:    Patient ID: Janice Flynn, female    DOB: [birth year]  MRN: [id number]  HPI  24 yowf never smoker with ? RML/lingular syndrome in 2007 with dx of "pna" and completely better until dust exposure in 2012 and then recurrent daily cough since > cxr 11/14/12 rx prednisone  And zmax and all better but CT abn so referred 01/19/2013 to pulmonary clinic by Dr Dionisio David.  01/19/2013 1st [facility] office visit/ Zniyah Midkiff Chief Complaint  Patient presents with  . Pulmonary Consult    Referred per Dr. Raechel Chute for eval of abnormal ct chest. She reports has occ chest discomfort. She had been having SOB and cough for approx 3 years after being exposed to dust, but this has improved a lot.    Doe x steps x 2 years mild not getting any worse no assoc cp. occ  cp at rest sitting gone in 15 sec once or twice a week x 2 year  Aware of hb x 6-8 mon rx prilosec then 40 mg per day 30 min before eat bfast with occ episodes while sleeping - cough is minimal and dry but was more prod of discolored thick mucus until finished abx  No obvious day to day or daytime variabilty or assoc  chest tightness, subjective wheeze overt sinus    symptoms. No unusual exp hx or h/o childhood pna/ asthma or knowledge of premature birth.  Sleeping ok without nocturnal  or early am exacerbation  of respiratory  c/o's or need for noct saba. Also denies any obvious fluctuation of symptoms with weather or environmental changes or other aggravating or alleviating factors except as outlined above   Current Medications, Allergies, Complete Past Medical History, Past Surgical History, Family History, and Social History were reviewed in [employer] record.  ROS  The following are not active complaints unless bolded sore throat, dysphagia, dental problems, itching, sneezing,  nasal congestion or excess/ purulent secretions, ear ache,   fever, chills, sweats, unintended wt loss, pleuritic or  exertional cp, hemoptysis,  orthopnea pnd or leg swelling, presyncope, palpitations, heartburn, abdominal pain, anorexia, nausea, vomiting, diarrhea  or change in bowel or urinary habits, change in stools or urine, dysuria,hematuria,  rash, arthralgias, visual complaints, headache, numbness weakness or ataxia or problems with walking or coordination,  change in mood/affect or memory.         Review of Systems  Constitutional: Negative for fever, chills and unexpected weight change.  HENT: Negative for ear pain, nosebleeds, congestion, sore throat, rhinorrhea, sneezing, trouble swallowing, dental problem, voice change, postnasal drip and sinus pressure.   Eyes: Negative for visual disturbance.  Respiratory: Positive for cough and shortness of breath. Negative for choking.   Cardiovascular: Positive for chest pain. Negative for leg swelling.  Gastrointestinal: Negative for vomiting, abdominal pain and diarrhea.  Genitourinary: Negative for difficulty urinating.       Acid Heartburn Indigestion  Musculoskeletal: Negative for arthralgias.  Skin: Negative for rash.  Neurological: Negative for tremors, syncope and headaches.  Hematological: Does not bruise/bleed easily.       Objective:   Physical Exam   amb wf nad   Wt Readings from Last 3 Encounters:  01/19/13 140 lb (63.504 kg)  11/14/12 137 lb (62.143 kg)  09/30/11 136 lb (61.689 kg)      HEENT: nl dentition, turbinates, and orophanx. Nl external ear canals without cough reflex   NECK :  without JVD/Nodes/TM/ nl carotid upstrokes bilaterally   LUNGS: no acc  muscle use, clear to A and P bilaterally without cough on insp or exp maneuvers   CV:  RRR  no s3 or murmur or increase in P2, no edema   ABD:  soft and nontender with nl excursion in the supine position. 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     Assessment & Plan:

## 2013-01-19 NOTE — Patient Instructions (Addendum)
Continue prilosec but add Pepcid ac 20 mg at bedtime to get 24 hour acid suppression  GERD (REFLUX)  is an extremely common cause of respiratory symptoms just like yours , many times with no significant heartburn at all.    It can be treated with medication, but also with lifestyle changes including avoidance of late meals, excessive alcohol, smoking cessation, and avoid fatty foods, chocolate, peppermint, colas, red wine, and acidic juices such as orange juice.  NO MINT OR MENTHOL PRODUCTS SO NO COUGH DROPS  USE SUGARLESS CANDY INSTEAD (jolley ranchers or Stover's)  NO OIL BASED VITAMINS - use powdered substitutes.  No Calcium carbonate - use calcium gluconate instead  Please schedule a follow up office visit in 6 weeks, call sooner if needed with pft's on return

## 2013-01-23 ENCOUNTER — Other Ambulatory Visit: Payer: Self-pay | Admitting: *Deleted

## 2013-02-12 ENCOUNTER — Encounter: Payer: Self-pay | Admitting: *Deleted

## 2013-02-20 ENCOUNTER — Ambulatory Visit (AMBULATORY_SURGERY_CENTER): Payer: Self-pay

## 2013-02-20 VITALS — Ht 63.0 in | Wt 136.2 lb

## 2013-02-20 DIAGNOSIS — Z800 Family history of malignant neoplasm of digestive organs: Secondary | ICD-10-CM

## 2013-02-20 MED ORDER — MOVIPREP 100 G PO SOLR: ORAL | Status: DC

## 2013-02-20 NOTE — Progress Notes (Signed)
Pt came into the office today for her pre-visit prior to her colonosocpy with Dr Juanda Chance on 03/06/13. There was a warning taking Moviprep due to side effect with Augmentin. Pt states she does not remember being allergic to Augmentin.She will call our office if she has problems with the prep.

## 2013-02-22 ENCOUNTER — Encounter: Payer: Self-pay | Admitting: Internal Medicine

## 2013-02-26 ENCOUNTER — Telehealth: Payer: Self-pay | Admitting: Internal Medicine

## 2013-02-26 ENCOUNTER — Encounter: Payer: Self-pay | Admitting: Internal Medicine

## 2013-02-26 NOTE — Telephone Encounter (Signed)
Janice Flynn  Call pt and let her know that her bone density has not changed significantly since two years ago.  Continue calcium and vitamin D    Will repeat bone density in two years

## 2013-02-26 NOTE — Telephone Encounter (Signed)
Notified pt of bone density results  

## 2013-02-27 ENCOUNTER — Encounter: Payer: Self-pay | Admitting: *Deleted

## 2013-02-28 ENCOUNTER — Other Ambulatory Visit: Payer: Self-pay | Admitting: *Deleted

## 2013-02-28 ENCOUNTER — Ambulatory Visit (INDEPENDENT_AMBULATORY_CARE_PROVIDER_SITE_OTHER): Payer: Medicare Other | Admitting: Internal Medicine

## 2013-02-28 ENCOUNTER — Encounter: Payer: Self-pay | Admitting: Internal Medicine

## 2013-02-28 ENCOUNTER — Telehealth: Payer: Self-pay | Admitting: *Deleted

## 2013-02-28 VITALS — BP 100/60 | HR 86 | Temp 97.8°F | Ht 62.25 in | Wt 136.0 lb

## 2013-02-28 DIAGNOSIS — R918 Other nonspecific abnormal finding of lung field: Secondary | ICD-10-CM

## 2013-02-28 DIAGNOSIS — K219 Gastro-esophageal reflux disease without esophagitis: Secondary | ICD-10-CM

## 2013-02-28 LAB — PULMONARY FUNCTION TEST

## 2013-02-28 NOTE — Progress Notes (Signed)
Subjective:    Patient ID: Janice Flynn, female    DOB: [birth year]  MRN: [id number]  Brief patient profile:  66 yowf never smoker with ? RML/lingular syndrome in 2007 with dx of "pna" and completely better until dust exposure in 2012 and then recurrent daily cough since > cxr 11/14/12 rx prednisone  And zmax and all better but CT abn so referred 01/19/2013 to pulmonary clinic by Dr Dionisio David.   History of Present Illness  01/19/2013 1st [facility] office visit/ Janice Flynn Chief Complaint  Patient presents with  . Pulmonary Consult    Referred per Dr. Raechel Chute for eval of abnormal ct chest. She reports has occ chest discomfort. She had been having SOB and cough for approx 3 years after being exposed to dust, but this has improved a lot.   Doe x steps x 2 years mild not getting any worse no assoc cp. occ  cp at rest sitting gone in 15 sec once or twice a week x 2 year  Aware of hb x 6-8 mon rx prilosec then 40 mg per day 30 min before eat bfast with occ episodes while sleeping - cough is minimal and dry but was more prod of discolored thick mucus until finished abx rec Continue prilosec but add Pepcid ac 20 mg at bedtime to get 24 hour acid suppression GERD     02/28/2013 f/u ov/Janice Flynn re:  RML and lingula syndrome  Chief Complaint  Patient presents with  . Followup with PFT    Pt states that chest discomfort has resolved. Cough and SOB are improved. She denies any new co's today.   No obvious day to day or daytime variabilty or assoc  chest tightness, subjective wheeze overt sinus    symptoms. No unusual exp hx or h/o childhood pna/ asthma or knowledge of premature birth.  Sleeping ok without nocturnal  or early am exacerbation  of respiratory  c/o's or need for noct saba. Also denies any obvious fluctuation of symptoms with weather or environmental changes or other aggravating or alleviating factors except as outlined above   Current Medications, Allergies, Complete Past  Medical History, Past Surgical History, Family History, and Social History were reviewed in [employer] record.  ROS  The following are not active complaints unless bolded sore throat, dysphagia, dental problems, itching, sneezing,  nasal congestion or excess/ purulent secretions, ear ache,   fever, chills, sweats, unintended wt loss, pleuritic or exertional cp, hemoptysis,  orthopnea pnd or leg swelling, presyncope, palpitations, heartburn, abdominal pain, anorexia, nausea, vomiting, diarrhea  or change in bowel or urinary habits, change in stools or urine, dysuria,hematuria,  rash, arthralgias, visual complaints, headache, numbness weakness or ataxia or problems with walking or coordination,  change in mood/affect or memory.               Objective:   Physical Exam   amb wf nad   02/28/2013     136  Wt Readings from Last 3 Encounters:  01/19/13 140 lb (63.504 kg)  11/14/12 137 lb (62.143 kg)  09/30/11 136 lb (61.689 kg)      HEENT: nl dentition, turbinates, and orophanx. Nl external ear canals without cough reflex   NECK :  without JVD/Nodes/TM/ nl carotid upstrokes bilaterally   LUNGS: no acc muscle use, clear to A and P bilaterally without cough on insp or exp maneuvers   CV:  RRR  no s3 or murmur or increase in P2, no edema   ABD:  soft and nontender with nl excursion in the supine position. 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     Assessment & Plan:

## 2013-02-28 NOTE — Telephone Encounter (Signed)
Per Dr. Juanda Chance, Dr. Sherene Sires sent her a message that patient needs EGD. She is scheduled on 03/06/13 for colonoscopy. Spoke with Harriett Sine at  [facility] and we can add it. Called and left a message for patient to call me to discuss if she wants to add this on 03/06/13.

## 2013-02-28 NOTE — Patient Instructions (Addendum)
Continue to treat the acid reflux as you are and discuss longterm options with Dr Juanda Chance when you see her   If you are satisfied with your treatment plan let your doctor know and he/she can either refill your medications or you can return here when your prescription runs out.     If in any way you are not 100% satisfied,  please tell us.  If 100% better, tell your friends!   Pulmonary follow up is as needed for cough

## 2013-02-28 NOTE — Progress Notes (Signed)
PFT done today. 

## 2013-02-28 NOTE — Telephone Encounter (Signed)
Spoke with patient and she wishes to add the EGD to 03/06/13 appointment. Ok to do this per Elmon Kirschner, Charity fundraiser. Added procedure to existing appointment.

## 2013-03-02 NOTE — Assessment & Plan Note (Signed)
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

## 2013-03-02 NOTE — Assessment & Plan Note (Signed)
Clearly responded to empirical rx > rec continue for now max acid suppression and diet and f/u with Dr Juanda Chance as planned

## 2013-03-06 ENCOUNTER — Encounter: Payer: Self-pay | Admitting: Internal Medicine

## 2013-03-06 ENCOUNTER — Ambulatory Visit (AMBULATORY_SURGERY_CENTER): Payer: Medicare Other | Admitting: Internal Medicine

## 2013-03-06 VITALS — BP 107/63 | HR 87 | Temp 97.5°F | Resp 18 | Ht 63.0 in | Wt 136.0 lb

## 2013-03-06 DIAGNOSIS — R05 Cough: Secondary | ICD-10-CM

## 2013-03-06 DIAGNOSIS — K219 Gastro-esophageal reflux disease without esophagitis: Secondary | ICD-10-CM

## 2013-03-06 DIAGNOSIS — Z1211 Encounter for screening for malignant neoplasm of colon: Secondary | ICD-10-CM

## 2013-03-06 DIAGNOSIS — Z800 Family history of malignant neoplasm of digestive organs: Secondary | ICD-10-CM

## 2013-03-06 DIAGNOSIS — R059 Cough, unspecified: Secondary | ICD-10-CM

## 2013-03-06 MED ORDER — SODIUM CHLORIDE 0.9 % IV SOLN: 500.0000 mL | INTRAVENOUS | Status: DC

## 2013-03-06 NOTE — Op Note (Signed)
[facility] [address] [place], [address]   COLONOSCOPY PROCEDURE REPORT  PATIENT: Janice, Flynn  MR#: [id number] BIRTHDATE: [birth year] , 67  yrs. old GENDER: Female ENDOSCOPIST: Hart Carwin, MD REFERRED IO:[id number] Constance Goltz, M.D. PROCEDURE DATE:  03/06/2013 PROCEDURE:   Colonoscopy, screening First Screening Colonoscopy - Avg.  risk and is 50 yrs.  old or older - No.  Prior Negative Screening - Now for repeat screening. 10 or more years since last screening  History of Adenoma - Now for follow-up colonoscopy & has been > or = to 3 yrs.  N/A  Polyps Removed Today? No.  Recommend repeat exam, <10 yrs? No. ASA CLASS:   Class II INDICATIONS:Average risk patient for colon cancer and last colonoscopy in August 2004.  Positive family history of colon cancer in a grandparent. MEDICATIONS: MAC sedation, administered by CRNA and propofol (Diprivan) 150mg  IV  DESCRIPTION OF PROCEDURE:   After the risks benefits and alternatives of the procedure were thoroughly explained, informed consent was obtained.  A digital rectal exam revealed no abnormalities of the rectum.   The LB [id number]  endoscope was introduced through the anus and advanced to the cecum, which was identified by both the appendix and ileocecal valve. No adverse events experienced.   The quality of the prep was Prepopik good The instrument was then slowly withdrawn as the colon was fully examined.      COLON FINDINGS: There was moderate diverticulosis noted in the sigmoid colon with associated muscular hypertrophy.  Retroflexed views revealed no abnormalities. The time to cecum=5 minutes 42 seconds.  Withdrawal time=7 minutes 10 seconds.  The scope was withdrawn and the procedure completed. COMPLICATIONS: There were no complications.  ENDOSCOPIC IMPRESSION: There was moderate diverticulosis noted in the sigmoid colon  RECOMMENDATIONS: 1.  High fiber diet 2.   recall colonoscopy in  10 years Recommend supplemental fiber on a daily basis   eSigned:  Hart Carwin, MD 03/06/2013 10:32 AM   cc:   PATIENT NAME:  Janice, Flynn MR#: [id number]

## 2013-03-06 NOTE — Patient Instructions (Signed)

## 2013-03-06 NOTE — Op Note (Signed)
[facility] [address] [place], [address]   ENDOSCOPY PROCEDURE REPORT  PATIENT: [name], Janice Flynn  MR#: [id number] BIRTHDATE: 1946-01-30 , 67  yrs. old GENDER: Female ENDOSCOPIST: Hart Carwin, MD REFERRED BY:  Raechel Chute, M.D. PROCEDURE DATE:  03/06/2013 PROCEDURE:  EGD, diagnostic ASA CLASS:     Class II INDICATIONS:  memory symptoms.  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  Nocturnal cough. MEDICATIONS: MAC sedation, administered by CRNA and propofol (Diprivan) 100mg  IV TOPICAL ANESTHETIC: Cetacaine Spray  DESCRIPTION OF PROCEDURE: After the risks benefits and alternatives of the procedure were thoroughly explained, informed consent was obtained.  The LB [id number] endoscope was introduced through the mouth and advanced to the second portion of the duodenum. Without limitations.  The instrument was slowly withdrawn as the mucosa was fully examined.      Esophagus: esophageal mucosa appeared normal in proximal mid and distal esophagus. There was no evidence of esophagitis. The squamocolumnar junction was normal. There was no hiatal hernia. Endoscope traversed into the stomach without difficulty Stomach: Gastric Fortson gastric mucosa appeared unremarkable. There was a small amount of acid food along the greater curvature of the stomach. The gastric antrum and pyloric outlet was normal. Retroflexion of the endoscope revealing normal fundus and cardia Duodenum: Duodenal bulb and descending duodenum were unremarkable [          The scope was then withdrawn from the patient and the procedure completed.  COMPLICATIONS: There were no complications. ENDOSCOPIC IMPRESSION:  essentially normal upper endoscopy of esophagus stomach and duodenum. No stigmata of chronic reflux. No hiatal hernia. No evidence of gastroparesis  RECOMMENDATIONS: Continue antireflux measures Continue Prilosec 40 mg a day and Pepcid at night. Consider low-dose Reglan at bedtime Proceed  with screening colonoscopy REPEAT EXAM:  eSigned:  Hart Carwin, MD 03/06/2013 10:28 AM   CC:

## 2013-03-06 NOTE — Progress Notes (Signed)
Patient did not experience any of the following events: a burn prior to discharge; a fall within the facility; wrong site/side/patient/procedure/implant event; or a hospital transfer or hospital admission upon discharge from the facility. (G8907) Patient did not have preoperative order for IV antibiotic SSI prophylaxis. (G8918)  

## 2013-03-07 ENCOUNTER — Telehealth: Payer: Self-pay | Admitting: *Deleted

## 2013-03-07 NOTE — Telephone Encounter (Signed)
  Follow up Call-  Call back number 03/06/2013  Post procedure Call Back phone  # [phone]  Permission to leave phone message Yes     Patient questions:  Do you have a fever, pain , or abdominal swelling? no Pain Score  0 *  Have you tolerated food without any problems? no  Have you been able to return to your normal activities? yes  Do you have any questions about your discharge instructions: Diet   no Medications  no Follow up visit  no  Do you have questions or concerns about your Care? no  Actions: * If pain score is 4 or above: No action needed, pain <4.  Pt c/o swallowing foods, tried rice last night, pt was able to swallow liquids just fine, pt denies pain or any other symptoms, advised pt to try something soft this am and if still having problems to give [place] a call back.-adm

## 2013-03-19 ENCOUNTER — Encounter: Payer: Self-pay | Admitting: Internal Medicine

## 2013-11-20 ENCOUNTER — Ambulatory Visit (INDEPENDENT_AMBULATORY_CARE_PROVIDER_SITE_OTHER): Payer: Medicare Other | Admitting: Internal Medicine

## 2013-11-20 ENCOUNTER — Encounter: Payer: Self-pay | Admitting: Internal Medicine

## 2013-11-20 ENCOUNTER — Ambulatory Visit (HOSPITAL_BASED_OUTPATIENT_CLINIC_OR_DEPARTMENT_OTHER)
Admission: RE | Admit: 2013-11-20 | Discharge: 2013-11-20 | Disposition: A | Discharge status: Home / Self Care | Payer: Medicare Other | Source: Ambulatory Visit | Attending: Internal Medicine | Admitting: Internal Medicine

## 2013-11-20 ENCOUNTER — Other Ambulatory Visit (HOSPITAL_COMMUNITY)
Admission: RE | Admit: 2013-11-20 | Discharge: 2013-11-20 | Disposition: A | Discharge status: Home / Self Care | Payer: Medicare Other | Source: Ambulatory Visit | Attending: Internal Medicine | Admitting: Internal Medicine

## 2013-11-20 VITALS — BP 103/63 | HR 84 | Temp 98.1°F | Resp 16 | Ht 63.0 in | Wt 137.0 lb

## 2013-11-20 DIAGNOSIS — R918 Other nonspecific abnormal finding of lung field: Secondary | ICD-10-CM | POA: Diagnosis present

## 2013-11-20 DIAGNOSIS — N3941 Urge incontinence: Secondary | ICD-10-CM

## 2013-11-20 DIAGNOSIS — M8580 Other specified disorders of bone density and structure, unspecified site: Secondary | ICD-10-CM

## 2013-11-20 DIAGNOSIS — Z124 Encounter for screening for malignant neoplasm of cervix: Secondary | ICD-10-CM | POA: Insufficient documentation

## 2013-11-20 DIAGNOSIS — K219 Gastro-esophageal reflux disease without esophagitis: Secondary | ICD-10-CM

## 2013-11-20 DIAGNOSIS — R05 Cough: Secondary | ICD-10-CM

## 2013-11-20 DIAGNOSIS — Z1151 Encounter for screening for human papillomavirus (HPV): Secondary | ICD-10-CM | POA: Insufficient documentation

## 2013-11-20 DIAGNOSIS — R059 Cough, unspecified: Secondary | ICD-10-CM

## 2013-11-20 DIAGNOSIS — M899 Disorder of bone, unspecified: Secondary | ICD-10-CM

## 2013-11-20 DIAGNOSIS — Z23 Encounter for immunization: Secondary | ICD-10-CM

## 2013-11-20 DIAGNOSIS — M949 Disorder of cartilage, unspecified: Secondary | ICD-10-CM

## 2013-11-20 DIAGNOSIS — Z0000 Encounter for general adult medical examination without abnormal findings: Secondary | ICD-10-CM

## 2013-11-20 LAB — HEMOCCULT GUIAC POC 1CARD (OFFICE): FECAL OCCULT BLD: NEGATIVE

## 2013-11-20 LAB — POCT URINALYSIS DIPSTICK
Bilirubin, UA: NEGATIVE
GLUCOSE UA: NEGATIVE
Ketones, UA: NEGATIVE
LEUKOCYTES UA: NEGATIVE
NITRITE UA: NEGATIVE
Protein, UA: NEGATIVE
RBC UA: NEGATIVE
Spec Grav, UA: <=1.005
Urobilinogen, UA: 0.2
pH, UA: 6

## 2013-11-20 MED ORDER — OMEPRAZOLE 20 MG PO CPDR: 20.0000 mg | DELAYED_RELEASE_CAPSULE | Freq: Every day | ORAL | Status: AC

## 2013-11-20 MED ORDER — ESTRADIOL 0.1 MG/GM VA CREA: 2.0000 g | TOPICAL_CREAM | VAGINAL | Status: AC

## 2013-11-20 NOTE — Patient Instructions (Signed)
See me in 10 weeks    To xray and pharmacy

## 2013-11-21 LAB — CBC WITH DIFFERENTIAL/PLATELET
BASOS ABS: 0 10*3/uL (ref 0.0–0.1)
BASOS PCT: 0 % (ref 0–1)
EOS ABS: 0.1 10*3/uL (ref 0.0–0.7)
EOS PCT: 1 % (ref 0–5)
HEMATOCRIT: 36.9 % (ref 36.0–46.0)
Hemoglobin: 12.7 g/dL (ref 12.0–15.0)
LYMPHS PCT: 24 % (ref 12–46)
Lymphs Abs: 1.2 10*3/uL (ref 0.7–4.0)
MCH: 28.4 pg (ref 26.0–34.0)
MCHC: 34.4 g/dL (ref 30.0–36.0)
MCV: 82.6 fL (ref 78.0–100.0)
MONO ABS: 0.5 10*3/uL (ref 0.1–1.0)
Monocytes Relative: 9 % (ref 3–12)
Neutro Abs: 3.4 10*3/uL (ref 1.7–7.7)
Neutrophils Relative %: 66 % (ref 43–77)
PLATELETS: 201 10*3/uL (ref 150–400)
RBC: 4.47 MIL/uL (ref 3.87–5.11)
RDW: 14.2 % (ref 11.5–15.5)
WBC: 5.2 10*3/uL (ref 4.0–10.5)

## 2013-11-21 LAB — COMPREHENSIVE METABOLIC PANEL
ALT: 12 U/L (ref 0–35)
AST: 20 U/L (ref 0–37)
Albumin: 4.1 g/dL (ref 3.5–5.2)
Alkaline Phosphatase: 74 U/L (ref 39–117)
BUN: 12 mg/dL (ref 6–23)
CHLORIDE: 104 meq/L (ref 96–112)
CO2: 26 meq/L (ref 19–32)
Calcium: 9 mg/dL (ref 8.4–10.5)
Creat: 0.84 mg/dL (ref 0.50–1.10)
Glucose, Bld: 93 mg/dL (ref 70–99)
POTASSIUM: 4.5 meq/L (ref 3.5–5.3)
Sodium: 139 mEq/L (ref 135–145)
TOTAL PROTEIN: 7.7 g/dL (ref 6.0–8.3)
Total Bilirubin: 0.6 mg/dL (ref 0.2–1.2)

## 2013-11-21 LAB — LIPID PANEL
Cholesterol: 154 mg/dL (ref 0–200)
HDL: 60 mg/dL (ref >39)
LDL Cholesterol: 81 mg/dL (ref 0–99)
Total CHOL/HDL Ratio: 2.6 Ratio
Triglycerides: 63 mg/dL (ref <150)
VLDL: 13 mg/dL (ref 0–40)

## 2013-11-21 LAB — TSH: TSH: 1.697 u[IU]/mL (ref 0.350–4.500)

## 2013-11-21 MED ORDER — PNEUMOCOCCAL 13-VAL CONJ VACC IM SUSP: 0.5000 mL | INTRAMUSCULAR | Status: DC

## 2013-11-21 NOTE — Addendum Note (Signed)
Addended by: Sherrye Payor on: 11/21/2013 01:51 PM   Modules accepted: Orders

## 2013-11-21 NOTE — Progress Notes (Signed)
Subjective:    Patient ID: Janice Flynn, female    DOB: 1945/12/15, 69 y.o.   MRN: [id number]  HPI Janice Flynn is here for CPE.    HM:  MM due October ,  Colonoscopy done 11/14, pap neg 2012  Pulmonary infiltrates: she was evaluated by pulmonologist See note  Advised follow up with CXR and not CT - pt 's clinical symptoms and cough improved and she felt well esp after taking PPI.Marland Kitchen   She has since stopped PPI and has a little heartburn symptoms last few weeks.  No cough reported.    GERD:  See above  Osteopenia  See DExa [facility]is9/14.    She is having problems with urinary urgency that wakes her at night.  She denies inconitnence with coughing or sneezing  Allergies  Allergen Reactions  . Augmentin [Amoxicillin-Pot Clavulanate] Diarrhea   Past Medical History  Diagnosis Date  . Osteopenia   . Anemia   . Diverticulosis of colon 2004  . Allergic rhinitis   . Arthritis   . Atrophic vaginitis   . IBS (irritable bowel syndrome)   . Cough   . GERD (gastroesophageal reflux disease)   . Pneumonia    Past Surgical History  Procedure Laterality Date  . Tubal ligation  02-17-78  . Colonoscopy     History   Social History  . Marital Status: Married    Spouse Name: Linwood    Number of Children: N/A  . Years of Education: 63yr clg   Occupational History  . BOOK KEEPER    Social History Main Topics  . Smoking status: Never Smoker   . Smokeless tobacco: Never Used  . Alcohol Use: 1.0 oz/week    2 drink(s) per week     Comment: 2-3 glasses of wine weekly  . Drug Use: No  . Sexual Activity: Yes    Partners: Male   Other Topics Concern  . Not on file   Social History Narrative  . No narrative on file   Family History  Problem Relation Age of Onset  . COPD Father     deceased  . Prostate cancer Father   . Alzheimer's disease Mother   . Osteoporosis Mother   . Ovarian cancer Maternal Grandmother   . Colon cancer Paternal Grandmother   . Prostate cancer Brother   .  Colon cancer Paternal Aunt    Patient Active Problem List   Diagnosis Date Noted  . Pulmonary infiltrates 01/19/2013  . Abnormal CT scan, lung 01/18/2013  . GERD (gastroesophageal reflux disease) 01/09/2013  . Urge incontinence 11/14/2012  . History of anemia 09/30/2011  . Diverticulosis 09/30/2011  . DJD (degenerative joint disease) 09/30/2011  . Insomnia 09/30/2011  . Osteopenia 02/22/2011   Current Outpatient Prescriptions on File Prior to Visit  Medication Sig Dispense Refill  . aspirin 81 MG tablet Take 81 mg by mouth daily.      . calcium carbonate 200 MG capsule Take 750 mg by mouth daily.      . Cholecalciferol (VITAMIN D) 1000 UNITS capsule Take 1,000 Units by mouth daily.        . famotidine (PEPCID) 20 MG tablet One at bedtime  30 tablet  11  . fluticasone (FLONASE) 50 MCG/ACT nasal spray Place 2 sprays into the nose as needed.       No current facility-administered medications on file prior to visit.       Review of Systems See HPI    Objective:   Physical Exam  Physical Exam  Vital signs and nursing note reviewed  Constitutional: She is oriented to person, place, and time. She appears well-developed and well-nourished. She is cooperative.  HENT:  Head: Normocephalic and atraumatic.  Right Ear: Tympanic membrane normal.  Left Ear: Tympanic membrane normal.  Nose: Nose normal.  Mouth/Throat: Oropharynx is clear and moist and mucous membranes are normal. No oropharyngeal exudate or posterior oropharyngeal erythema.  Eyes: Conjunctivae and EOM are normal. Pupils are equal, round, and reactive to light.  Neck: Neck supple. No JVD present. Carotid bruit is not present. No mass and no thyromegaly present.  Cardiovascular: Regular rhythm, normal heart sounds, intact distal pulses and normal pulses.  Exam reveals no gallop and no friction rub.   No murmur heard. Pulses:      Dorsalis pedis pulses are 2+ on the right side, and 2+ on the left side.    Pulmonary/Chest: Breath sounds normal. She has no wheezes. She has no rhonchi. She has no rales. Right breast exhibits no mass, no nipple discharge and no skin change. Left breast exhibits no mass, no nipple discharge and no skin change.   Abdominal: Soft. Bowel sounds are normal. She exhibits no distension and no mass. There is no hepatosplenomegaly. There is no tenderness. There is no CVA tenderness.  Genitourinary: Rectum normal, vagina normal and uterus normal. Rectal exam shows no mass. Guaiac negative stool. No labial fusion. There is no lesion on the right labia. There is no lesion on the left labia. Cervix exhibits no motion tenderness. Right adnexum displays no mass, no tenderness and no fullness. Left adnexum displays no mass, no tenderness and no fullness. No erythema around the vagina.  Musculoskeletal:       No active synovitis to any joint.    Lymphadenopathy:       Right cervical: No superficial cervical adenopathy present.      Left cervical: No superficial cervical adenopathy present.       Right axillary: No pectoral and no lateral adenopathy present.       Left axillary: No pectoral and no lateral adenopathy present.      Right: No inguinal adenopathy present.       Left: No inguinal adenopathy present.  Neurological: She is alert and oriented to person, place, and time. She has normal strength and normal reflexes. No cranial nerve deficit or sensory deficit. She displays a negative Romberg sign. Coordination and gait normal.  Skin: Skin is warm and dry. No abrasion, no bruising, no ecchymosis and no rash noted. No cyanosis. Nails show no clubbing.  Psychiatric: She has a normal mood and affect. Her speech is normal and behavior is normal.          Assessment & Plan:  HM:  Pap today,  Dexa next year,  Advised 3D mm in October.  Prevnar 13 given today  Pulm infiltrates on CT.  Clinically well but will get repeat CXR today  Further management based on results    Urge  incontinence  U/A today normal  Ok to try OTC Ditropan patch  Advised SE of dry mouth,   She does not have h/O glaucoma.    GERD  RX resume daily prilosec     Follow up with me in 3 months or sooner prn         Assessment & Plan:

## 2013-11-22 ENCOUNTER — Telehealth: Payer: Self-pay | Admitting: Internal Medicine

## 2013-11-22 ENCOUNTER — Encounter: Payer: Self-pay | Admitting: Internal Medicine

## 2013-11-22 LAB — VITAMIN D 25 HYDROXY (VIT D DEFICIENCY, FRACTURES): VIT D 25 HYDROXY: 34 ng/mL (ref 30–89)

## 2013-11-22 NOTE — Telephone Encounter (Signed)
Spoke with Pamala Hurry and gave her Dr Gustavus Bryant #

## 2013-11-22 NOTE — Telephone Encounter (Signed)
Spoke with pt and informed of CXR results    She would like to discuss with Dr. Melvyn Novas pt states she will make appointment with him as she is about to go on vacation    Will have Heather give pt DR. Wert's office number so pt can make appt

## 2013-11-26 LAB — CYTOLOGY - PAP

## 2013-11-27 ENCOUNTER — Encounter: Payer: Self-pay | Admitting: Internal Medicine

## 2013-11-27 ENCOUNTER — Encounter: Payer: Self-pay | Admitting: *Deleted

## 2014-01-10 ENCOUNTER — Other Ambulatory Visit: Payer: Self-pay | Admitting: Dermatology

## 2014-01-24 ENCOUNTER — Other Ambulatory Visit: Payer: Self-pay | Admitting: Internal Medicine

## 2014-02-10 IMAGING — CT CT CHEST W/O CM
2 of 3 series · 15 of 36 positions shown, 18 images · non-contrast
Comparison: Chest radiograph 01/09/2013

CLINICAL DATA: Right middle lobe abnormality on prior exam

EXAM:
CT CHEST WITHOUT CONTRAST
TECHNIQUE: Multidetector CT imaging of the chest was performed following the
standard protocol without IV contrast.

[Series 2: chest 5.0 b31f · axial · 0.59mm/px · z∈[+1185,+1435]mm · 12 of 60 slices shown, 15 images]
[im 5/60  mediastinal]
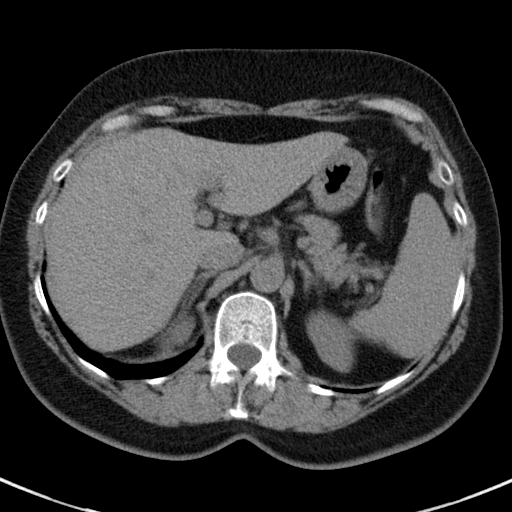
[im 5/60  lung]
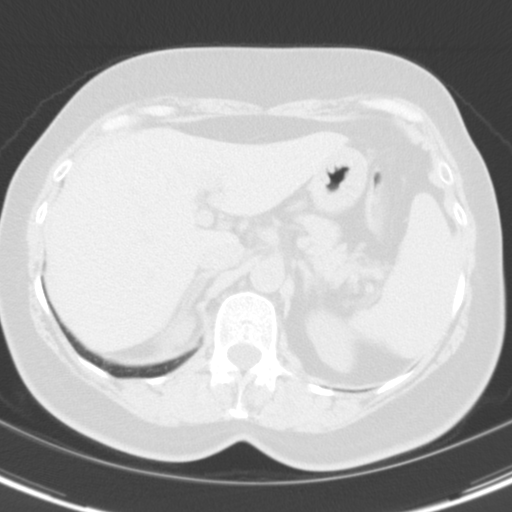
[im 9/60  lung]
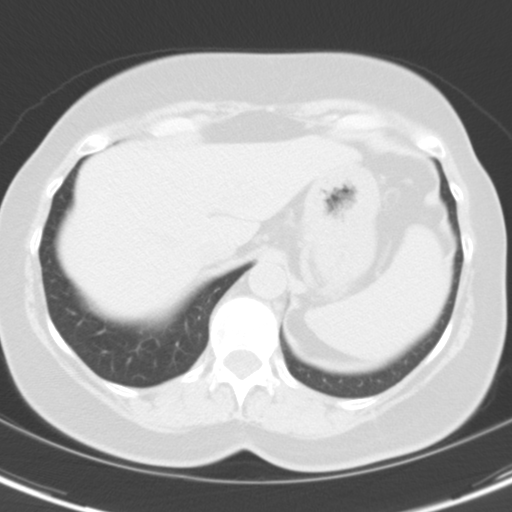
[im 14/60  lung]
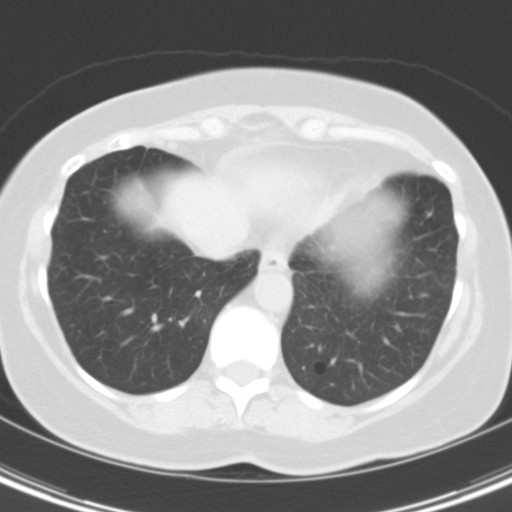
[im 18/60  lung]
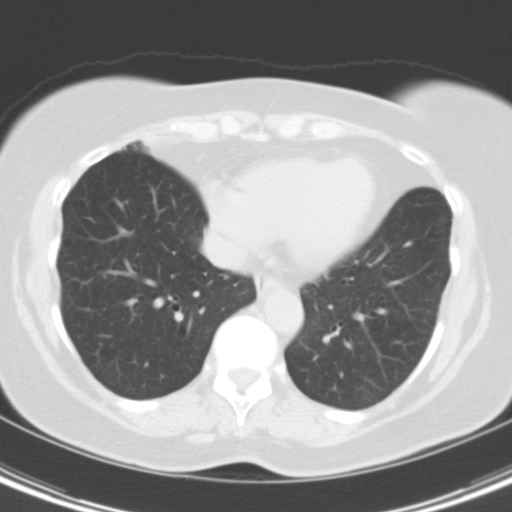
[im 22/60  mediastinal]
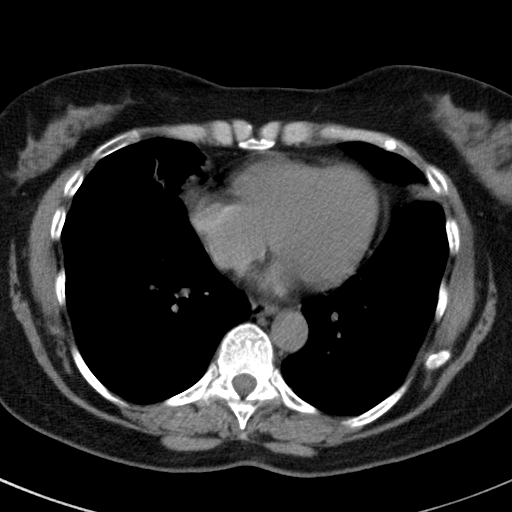
[im 22/60  lung]
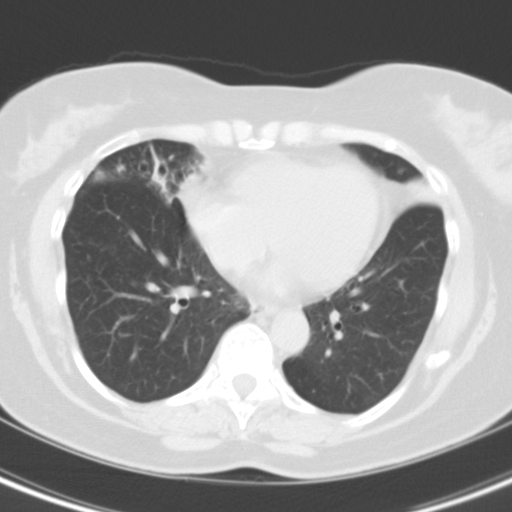
[im 27/60  lung]
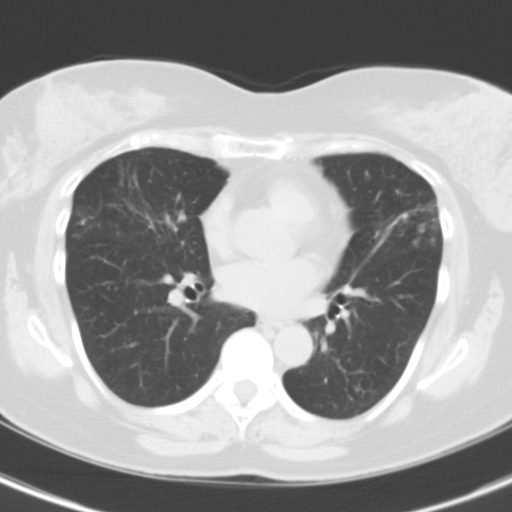
[im 33/60  lung]
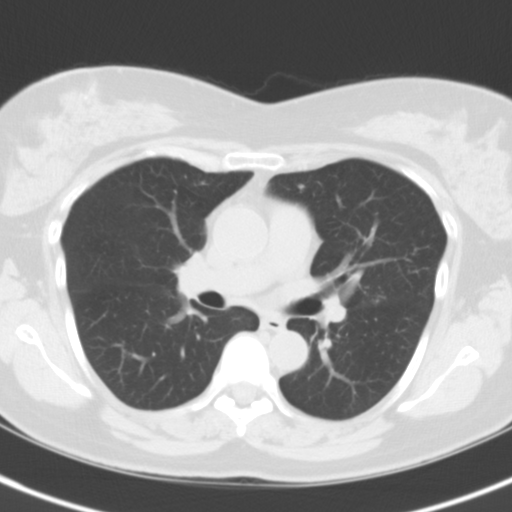
[im 38/60  lung]
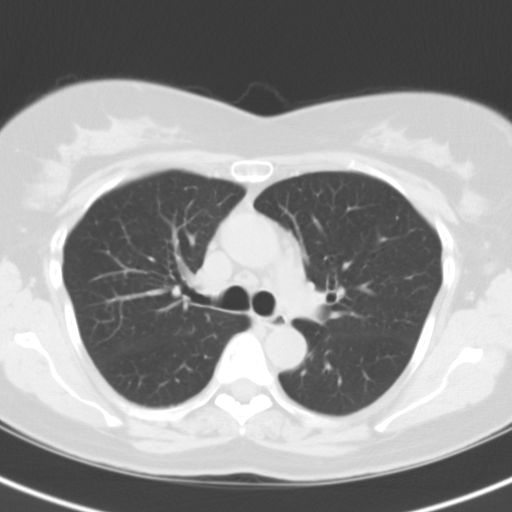
[im 42/60  mediastinal]
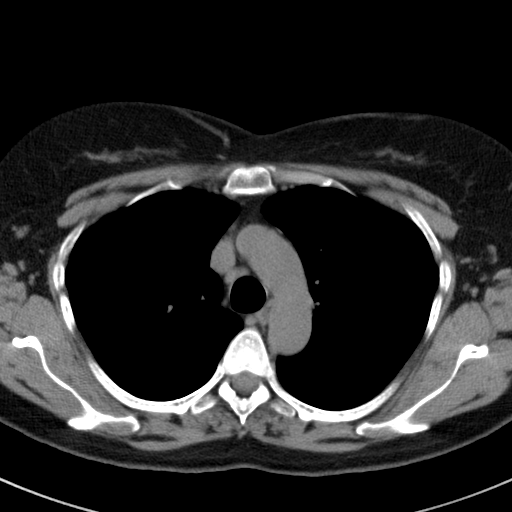
[im 42/60  lung]
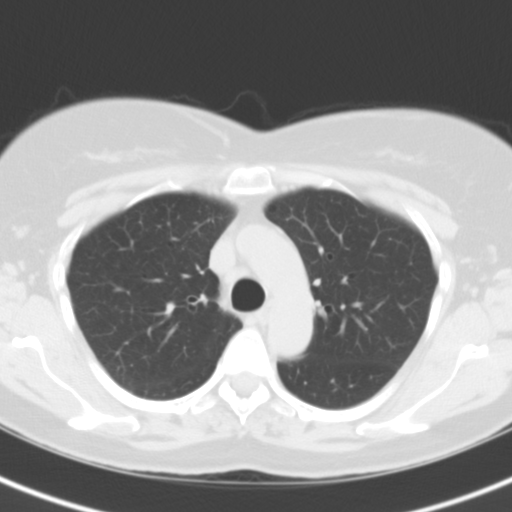
[im 46/60  lung]
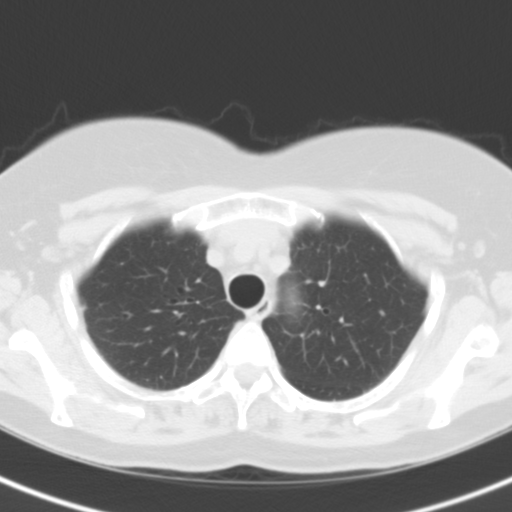
[im 51/60  lung]
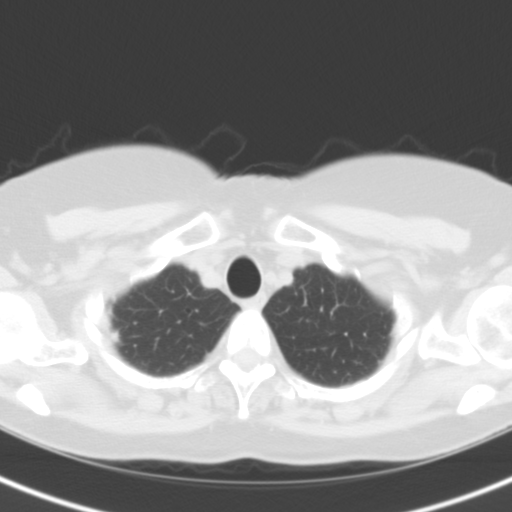
[im 55/60  lung]
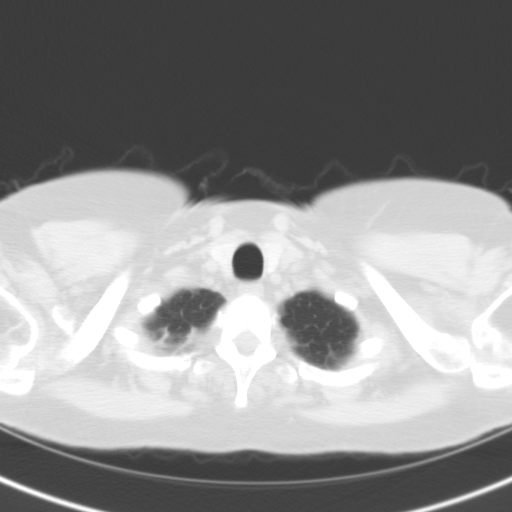

[Series 6: chest 3.0 coronal · coronal · 0.59mm/px · 3 of 81 slices shown]
[im 17/81  lung]
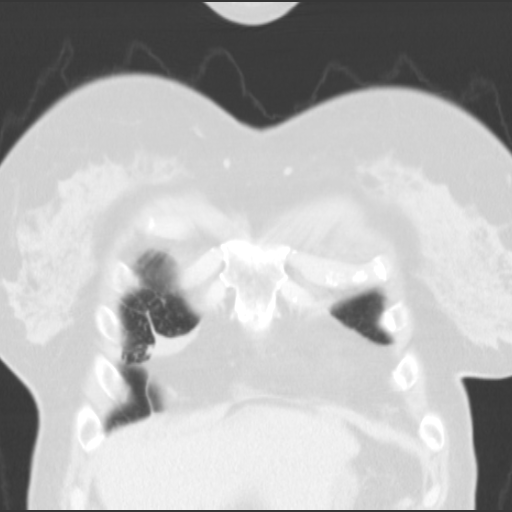
[im 33/81  lung]
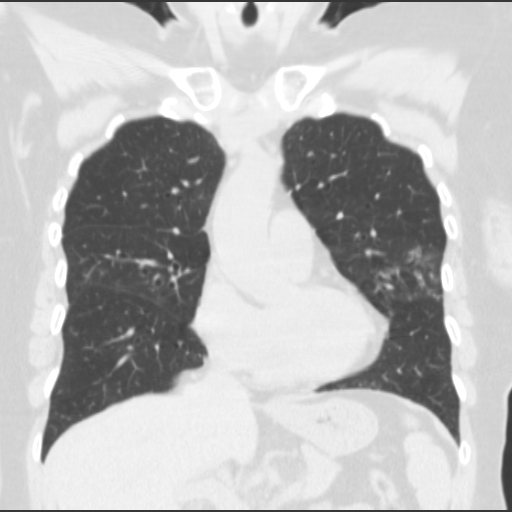
[im 49/81  lung]
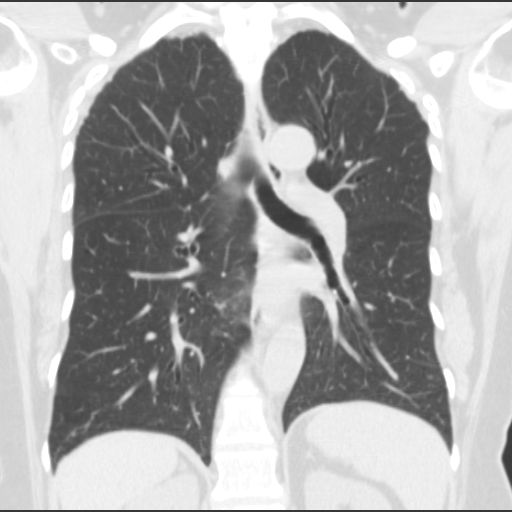

[15 of 36 positions shown; findings below may reference images not displayed]

FINDINGS: Small mediastinal lymph nodes are within normal limits. Heart size
normal. No pericardial or pleural effusion. Great vessels are normal
in caliber. Incomplete imaging of the upper abdomen demonstrates
normal appearing adrenal glands. There are areas of focal
bronchiectasis and peripheral atelectasis/ consolidation in the
right middle lobe and lingula, corresponding to the findings on
prior exam. A few areas of tree-in-bud type nodular airspace opacity
are identified. No focal mass or nodule is identified elsewhere. No
acute osseous finding.
IMPRESSION: Findings are typical for small airways infection in the right middle
lobe and lingula, with a distribution most typical for Mycobacterium
avium complex (NANO) infection. This corresponds to the finding on
prior dissimilar exam.

## 2014-02-18 ENCOUNTER — Encounter: Payer: Self-pay | Admitting: Internal Medicine

## 2014-02-18 ENCOUNTER — Ambulatory Visit: Payer: Medicare Other | Admitting: Internal Medicine

## 2014-02-25 ENCOUNTER — Encounter: Payer: Self-pay | Admitting: *Deleted

## 2014-08-16 ENCOUNTER — Other Ambulatory Visit: Payer: Self-pay | Admitting: Dermatology

## 2014-09-18 ENCOUNTER — Encounter: Payer: Self-pay | Admitting: Internal Medicine

## 2014-10-14 ENCOUNTER — Other Ambulatory Visit: Payer: Self-pay

## 2015-09-23 DIAGNOSIS — R69 Illness, unspecified: Secondary | ICD-10-CM | POA: Diagnosis not present

## 2015-09-26 DIAGNOSIS — L931 Subacute cutaneous lupus erythematosus: Secondary | ICD-10-CM | POA: Diagnosis not present

## 2015-09-26 DIAGNOSIS — M19042 Primary osteoarthritis, left hand: Secondary | ICD-10-CM | POA: Diagnosis not present

## 2015-09-26 DIAGNOSIS — M3500 Sicca syndrome, unspecified: Secondary | ICD-10-CM | POA: Diagnosis not present

## 2015-09-26 DIAGNOSIS — M19041 Primary osteoarthritis, right hand: Secondary | ICD-10-CM | POA: Diagnosis not present

## 2015-10-13 DIAGNOSIS — M3500 Sicca syndrome, unspecified: Secondary | ICD-10-CM | POA: Diagnosis not present

## 2015-10-13 DIAGNOSIS — H5203 Hypermetropia, bilateral: Secondary | ICD-10-CM | POA: Diagnosis not present

## 2015-10-13 DIAGNOSIS — H04123 Dry eye syndrome of bilateral lacrimal glands: Secondary | ICD-10-CM | POA: Diagnosis not present

## 2015-10-13 DIAGNOSIS — H524 Presbyopia: Secondary | ICD-10-CM | POA: Diagnosis not present

## 2015-10-13 DIAGNOSIS — H2513 Age-related nuclear cataract, bilateral: Secondary | ICD-10-CM | POA: Diagnosis not present

## 2015-12-26 DIAGNOSIS — Z1159 Encounter for screening for other viral diseases: Secondary | ICD-10-CM | POA: Diagnosis not present

## 2015-12-26 DIAGNOSIS — R399 Unspecified symptoms and signs involving the genitourinary system: Secondary | ICD-10-CM | POA: Diagnosis not present

## 2015-12-26 DIAGNOSIS — E559 Vitamin D deficiency, unspecified: Secondary | ICD-10-CM | POA: Diagnosis not present

## 2015-12-26 DIAGNOSIS — N8110 Cystocele, unspecified: Secondary | ICD-10-CM | POA: Diagnosis not present

## 2015-12-26 DIAGNOSIS — M329 Systemic lupus erythematosus, unspecified: Secondary | ICD-10-CM | POA: Diagnosis not present

## 2015-12-26 DIAGNOSIS — Z0000 Encounter for general adult medical examination without abnormal findings: Secondary | ICD-10-CM | POA: Diagnosis not present

## 2015-12-26 DIAGNOSIS — K219 Gastro-esophageal reflux disease without esophagitis: Secondary | ICD-10-CM | POA: Diagnosis not present

## 2015-12-26 DIAGNOSIS — M810 Age-related osteoporosis without current pathological fracture: Secondary | ICD-10-CM | POA: Diagnosis not present

## 2016-01-12 DIAGNOSIS — R69 Illness, unspecified: Secondary | ICD-10-CM | POA: Diagnosis not present

## 2016-03-01 DIAGNOSIS — Z1231 Encounter for screening mammogram for malignant neoplasm of breast: Secondary | ICD-10-CM | POA: Diagnosis not present

## 2016-03-24 DIAGNOSIS — L918 Other hypertrophic disorders of the skin: Secondary | ICD-10-CM | POA: Diagnosis not present

## 2016-03-24 DIAGNOSIS — D1801 Hemangioma of skin and subcutaneous tissue: Secondary | ICD-10-CM | POA: Diagnosis not present

## 2016-03-24 DIAGNOSIS — D225 Melanocytic nevi of trunk: Secondary | ICD-10-CM | POA: Diagnosis not present

## 2016-03-24 DIAGNOSIS — L814 Other melanin hyperpigmentation: Secondary | ICD-10-CM | POA: Diagnosis not present

## 2016-03-24 DIAGNOSIS — L821 Other seborrheic keratosis: Secondary | ICD-10-CM | POA: Diagnosis not present

## 2016-03-24 DIAGNOSIS — D2272 Melanocytic nevi of left lower limb, including hip: Secondary | ICD-10-CM | POA: Diagnosis not present

## 2016-03-26 DIAGNOSIS — M19042 Primary osteoarthritis, left hand: Secondary | ICD-10-CM | POA: Diagnosis not present

## 2016-03-26 DIAGNOSIS — M19041 Primary osteoarthritis, right hand: Secondary | ICD-10-CM | POA: Diagnosis not present

## 2016-03-26 DIAGNOSIS — L931 Subacute cutaneous lupus erythematosus: Secondary | ICD-10-CM | POA: Diagnosis not present

## 2016-03-26 DIAGNOSIS — M3500 Sicca syndrome, unspecified: Secondary | ICD-10-CM | POA: Diagnosis not present

## 2016-03-26 DIAGNOSIS — Z6825 Body mass index (BMI) 25.0-25.9, adult: Secondary | ICD-10-CM | POA: Diagnosis not present

## 2016-03-26 DIAGNOSIS — E663 Overweight: Secondary | ICD-10-CM | POA: Diagnosis not present

## 2016-03-30 DIAGNOSIS — R69 Illness, unspecified: Secondary | ICD-10-CM | POA: Diagnosis not present

## 2016-09-24 DIAGNOSIS — M3500 Sicca syndrome, unspecified: Secondary | ICD-10-CM | POA: Diagnosis not present

## 2016-09-24 DIAGNOSIS — L931 Subacute cutaneous lupus erythematosus: Secondary | ICD-10-CM | POA: Diagnosis not present

## 2016-09-24 DIAGNOSIS — M19042 Primary osteoarthritis, left hand: Secondary | ICD-10-CM | POA: Diagnosis not present

## 2016-09-24 DIAGNOSIS — Z6825 Body mass index (BMI) 25.0-25.9, adult: Secondary | ICD-10-CM | POA: Diagnosis not present

## 2016-09-24 DIAGNOSIS — E663 Overweight: Secondary | ICD-10-CM | POA: Diagnosis not present

## 2016-09-24 DIAGNOSIS — M19041 Primary osteoarthritis, right hand: Secondary | ICD-10-CM | POA: Diagnosis not present

## 2016-09-30 DIAGNOSIS — R69 Illness, unspecified: Secondary | ICD-10-CM | POA: Diagnosis not present

## 2016-12-13 DIAGNOSIS — H52223 Regular astigmatism, bilateral: Secondary | ICD-10-CM | POA: Diagnosis not present

## 2016-12-13 DIAGNOSIS — H5203 Hypermetropia, bilateral: Secondary | ICD-10-CM | POA: Diagnosis not present

## 2016-12-13 DIAGNOSIS — H524 Presbyopia: Secondary | ICD-10-CM | POA: Diagnosis not present

## 2016-12-27 DIAGNOSIS — R3989 Other symptoms and signs involving the genitourinary system: Secondary | ICD-10-CM | POA: Diagnosis not present

## 2016-12-27 DIAGNOSIS — Z0000 Encounter for general adult medical examination without abnormal findings: Secondary | ICD-10-CM | POA: Diagnosis not present

## 2016-12-27 DIAGNOSIS — E559 Vitamin D deficiency, unspecified: Secondary | ICD-10-CM | POA: Diagnosis not present

## 2016-12-27 DIAGNOSIS — M329 Systemic lupus erythematosus, unspecified: Secondary | ICD-10-CM | POA: Diagnosis not present

## 2016-12-27 DIAGNOSIS — Z13220 Encounter for screening for lipoid disorders: Secondary | ICD-10-CM | POA: Diagnosis not present

## 2016-12-27 DIAGNOSIS — Z23 Encounter for immunization: Secondary | ICD-10-CM | POA: Diagnosis not present

## 2016-12-27 DIAGNOSIS — R5383 Other fatigue: Secondary | ICD-10-CM | POA: Diagnosis not present

## 2016-12-27 DIAGNOSIS — N952 Postmenopausal atrophic vaginitis: Secondary | ICD-10-CM | POA: Diagnosis not present

## 2016-12-28 DIAGNOSIS — N952 Postmenopausal atrophic vaginitis: Secondary | ICD-10-CM | POA: Diagnosis not present

## 2016-12-28 DIAGNOSIS — R3989 Other symptoms and signs involving the genitourinary system: Secondary | ICD-10-CM | POA: Diagnosis not present

## 2016-12-28 DIAGNOSIS — Z0000 Encounter for general adult medical examination without abnormal findings: Secondary | ICD-10-CM | POA: Diagnosis not present

## 2016-12-28 DIAGNOSIS — E559 Vitamin D deficiency, unspecified: Secondary | ICD-10-CM | POA: Diagnosis not present

## 2016-12-28 DIAGNOSIS — R5383 Other fatigue: Secondary | ICD-10-CM | POA: Diagnosis not present

## 2016-12-28 DIAGNOSIS — Z13220 Encounter for screening for lipoid disorders: Secondary | ICD-10-CM | POA: Diagnosis not present

## 2016-12-28 DIAGNOSIS — M329 Systemic lupus erythematosus, unspecified: Secondary | ICD-10-CM | POA: Diagnosis not present

## 2016-12-28 DIAGNOSIS — Z23 Encounter for immunization: Secondary | ICD-10-CM | POA: Diagnosis not present

## 2017-01-11 DIAGNOSIS — H2513 Age-related nuclear cataract, bilateral: Secondary | ICD-10-CM | POA: Diagnosis not present

## 2017-01-11 DIAGNOSIS — H524 Presbyopia: Secondary | ICD-10-CM | POA: Diagnosis not present

## 2017-01-11 DIAGNOSIS — H25813 Combined forms of age-related cataract, bilateral: Secondary | ICD-10-CM | POA: Diagnosis not present

## 2017-01-11 DIAGNOSIS — H04123 Dry eye syndrome of bilateral lacrimal glands: Secondary | ICD-10-CM | POA: Diagnosis not present

## 2017-01-11 DIAGNOSIS — M3500 Sicca syndrome, unspecified: Secondary | ICD-10-CM | POA: Diagnosis not present

## 2017-01-19 DIAGNOSIS — H2511 Age-related nuclear cataract, right eye: Secondary | ICD-10-CM | POA: Diagnosis not present

## 2017-01-21 DIAGNOSIS — R69 Illness, unspecified: Secondary | ICD-10-CM | POA: Diagnosis not present

## 2017-02-02 DIAGNOSIS — H2511 Age-related nuclear cataract, right eye: Secondary | ICD-10-CM | POA: Diagnosis not present

## 2017-02-02 DIAGNOSIS — H25811 Combined forms of age-related cataract, right eye: Secondary | ICD-10-CM | POA: Diagnosis not present

## 2017-02-08 DIAGNOSIS — H2512 Age-related nuclear cataract, left eye: Secondary | ICD-10-CM | POA: Diagnosis not present

## 2017-02-21 DIAGNOSIS — H2512 Age-related nuclear cataract, left eye: Secondary | ICD-10-CM | POA: Diagnosis not present

## 2017-02-21 DIAGNOSIS — H25812 Combined forms of age-related cataract, left eye: Secondary | ICD-10-CM | POA: Diagnosis not present

## 2017-02-24 DIAGNOSIS — D225 Melanocytic nevi of trunk: Secondary | ICD-10-CM | POA: Diagnosis not present

## 2017-02-24 DIAGNOSIS — L570 Actinic keratosis: Secondary | ICD-10-CM | POA: Diagnosis not present

## 2017-02-24 DIAGNOSIS — D1801 Hemangioma of skin and subcutaneous tissue: Secondary | ICD-10-CM | POA: Diagnosis not present

## 2017-02-24 DIAGNOSIS — L718 Other rosacea: Secondary | ICD-10-CM | POA: Diagnosis not present

## 2017-02-24 DIAGNOSIS — D2262 Melanocytic nevi of left upper limb, including shoulder: Secondary | ICD-10-CM | POA: Diagnosis not present

## 2017-02-24 DIAGNOSIS — L814 Other melanin hyperpigmentation: Secondary | ICD-10-CM | POA: Diagnosis not present

## 2017-02-24 DIAGNOSIS — D2261 Melanocytic nevi of right upper limb, including shoulder: Secondary | ICD-10-CM | POA: Diagnosis not present

## 2017-02-24 DIAGNOSIS — L304 Erythema intertrigo: Secondary | ICD-10-CM | POA: Diagnosis not present

## 2017-02-24 DIAGNOSIS — D2272 Melanocytic nevi of left lower limb, including hip: Secondary | ICD-10-CM | POA: Diagnosis not present

## 2017-02-24 DIAGNOSIS — L821 Other seborrheic keratosis: Secondary | ICD-10-CM | POA: Diagnosis not present

## 2017-03-15 DIAGNOSIS — M19041 Primary osteoarthritis, right hand: Secondary | ICD-10-CM | POA: Diagnosis not present

## 2017-03-15 DIAGNOSIS — M19042 Primary osteoarthritis, left hand: Secondary | ICD-10-CM | POA: Diagnosis not present

## 2017-03-15 DIAGNOSIS — Z6825 Body mass index (BMI) 25.0-25.9, adult: Secondary | ICD-10-CM | POA: Diagnosis not present

## 2017-03-15 DIAGNOSIS — E663 Overweight: Secondary | ICD-10-CM | POA: Diagnosis not present

## 2017-03-15 DIAGNOSIS — M3500 Sicca syndrome, unspecified: Secondary | ICD-10-CM | POA: Diagnosis not present

## 2017-03-15 DIAGNOSIS — L931 Subacute cutaneous lupus erythematosus: Secondary | ICD-10-CM | POA: Diagnosis not present

## 2017-04-05 DIAGNOSIS — R69 Illness, unspecified: Secondary | ICD-10-CM | POA: Diagnosis not present

## 2017-05-26 DIAGNOSIS — L718 Other rosacea: Secondary | ICD-10-CM | POA: Diagnosis not present

## 2017-05-26 DIAGNOSIS — L930 Discoid lupus erythematosus: Secondary | ICD-10-CM | POA: Diagnosis not present

## 2017-06-10 DIAGNOSIS — M8589 Other specified disorders of bone density and structure, multiple sites: Secondary | ICD-10-CM | POA: Diagnosis not present

## 2017-06-10 DIAGNOSIS — Z1231 Encounter for screening mammogram for malignant neoplasm of breast: Secondary | ICD-10-CM | POA: Diagnosis not present

## 2017-06-20 DIAGNOSIS — Z79899 Other long term (current) drug therapy: Secondary | ICD-10-CM | POA: Diagnosis not present

## 2017-06-20 DIAGNOSIS — L931 Subacute cutaneous lupus erythematosus: Secondary | ICD-10-CM | POA: Diagnosis not present

## 2017-06-20 DIAGNOSIS — M3500 Sicca syndrome, unspecified: Secondary | ICD-10-CM | POA: Diagnosis not present

## 2017-06-20 DIAGNOSIS — E663 Overweight: Secondary | ICD-10-CM | POA: Diagnosis not present

## 2017-06-20 DIAGNOSIS — Z6825 Body mass index (BMI) 25.0-25.9, adult: Secondary | ICD-10-CM | POA: Diagnosis not present

## 2017-09-27 DIAGNOSIS — H26493 Other secondary cataract, bilateral: Secondary | ICD-10-CM | POA: Diagnosis not present

## 2017-09-27 DIAGNOSIS — H04123 Dry eye syndrome of bilateral lacrimal glands: Secondary | ICD-10-CM | POA: Diagnosis not present

## 2017-09-27 DIAGNOSIS — H524 Presbyopia: Secondary | ICD-10-CM | POA: Diagnosis not present

## 2017-09-27 DIAGNOSIS — Z961 Presence of intraocular lens: Secondary | ICD-10-CM | POA: Diagnosis not present

## 2017-09-27 DIAGNOSIS — M3500 Sicca syndrome, unspecified: Secondary | ICD-10-CM | POA: Diagnosis not present

## 2017-10-04 DIAGNOSIS — Z961 Presence of intraocular lens: Secondary | ICD-10-CM | POA: Diagnosis not present

## 2017-10-04 DIAGNOSIS — Z0100 Encounter for examination of eyes and vision without abnormal findings: Secondary | ICD-10-CM | POA: Diagnosis not present

## 2017-10-04 DIAGNOSIS — H26492 Other secondary cataract, left eye: Secondary | ICD-10-CM | POA: Diagnosis not present

## 2017-12-22 DIAGNOSIS — E663 Overweight: Secondary | ICD-10-CM | POA: Diagnosis not present

## 2017-12-22 DIAGNOSIS — M19042 Primary osteoarthritis, left hand: Secondary | ICD-10-CM | POA: Diagnosis not present

## 2017-12-22 DIAGNOSIS — Z79899 Other long term (current) drug therapy: Secondary | ICD-10-CM | POA: Diagnosis not present

## 2017-12-22 DIAGNOSIS — M19041 Primary osteoarthritis, right hand: Secondary | ICD-10-CM | POA: Diagnosis not present

## 2017-12-22 DIAGNOSIS — Z6825 Body mass index (BMI) 25.0-25.9, adult: Secondary | ICD-10-CM | POA: Diagnosis not present

## 2017-12-22 DIAGNOSIS — L931 Subacute cutaneous lupus erythematosus: Secondary | ICD-10-CM | POA: Diagnosis not present

## 2017-12-22 DIAGNOSIS — M3500 Sicca syndrome, unspecified: Secondary | ICD-10-CM | POA: Diagnosis not present

## 2018-01-10 DIAGNOSIS — R69 Illness, unspecified: Secondary | ICD-10-CM | POA: Diagnosis not present

## 2018-01-16 DIAGNOSIS — R5383 Other fatigue: Secondary | ICD-10-CM | POA: Diagnosis not present

## 2018-01-16 DIAGNOSIS — R3989 Other symptoms and signs involving the genitourinary system: Secondary | ICD-10-CM | POA: Diagnosis not present

## 2018-01-16 DIAGNOSIS — E559 Vitamin D deficiency, unspecified: Secondary | ICD-10-CM | POA: Diagnosis not present

## 2018-01-16 DIAGNOSIS — M329 Systemic lupus erythematosus, unspecified: Secondary | ICD-10-CM | POA: Diagnosis not present

## 2018-01-16 DIAGNOSIS — Z0000 Encounter for general adult medical examination without abnormal findings: Secondary | ICD-10-CM | POA: Diagnosis not present

## 2018-01-16 DIAGNOSIS — Z13220 Encounter for screening for lipoid disorders: Secondary | ICD-10-CM | POA: Diagnosis not present

## 2018-01-16 DIAGNOSIS — M3500 Sicca syndrome, unspecified: Secondary | ICD-10-CM | POA: Diagnosis not present

## 2018-02-06 DIAGNOSIS — R69 Illness, unspecified: Secondary | ICD-10-CM | POA: Diagnosis not present

## 2018-03-01 DIAGNOSIS — L814 Other melanin hyperpigmentation: Secondary | ICD-10-CM | POA: Diagnosis not present

## 2018-03-01 DIAGNOSIS — D225 Melanocytic nevi of trunk: Secondary | ICD-10-CM | POA: Diagnosis not present

## 2018-03-01 DIAGNOSIS — L821 Other seborrheic keratosis: Secondary | ICD-10-CM | POA: Diagnosis not present

## 2018-03-01 DIAGNOSIS — D1801 Hemangioma of skin and subcutaneous tissue: Secondary | ICD-10-CM | POA: Diagnosis not present

## 2018-03-01 DIAGNOSIS — L931 Subacute cutaneous lupus erythematosus: Secondary | ICD-10-CM | POA: Diagnosis not present

## 2018-04-25 DIAGNOSIS — H04123 Dry eye syndrome of bilateral lacrimal glands: Secondary | ICD-10-CM | POA: Diagnosis not present

## 2018-04-25 DIAGNOSIS — M3500 Sicca syndrome, unspecified: Secondary | ICD-10-CM | POA: Diagnosis not present

## 2018-04-25 DIAGNOSIS — Z961 Presence of intraocular lens: Secondary | ICD-10-CM | POA: Diagnosis not present

## 2018-04-25 DIAGNOSIS — H35373 Puckering of macula, bilateral: Secondary | ICD-10-CM | POA: Diagnosis not present

## 2018-06-13 DIAGNOSIS — R69 Illness, unspecified: Secondary | ICD-10-CM | POA: Diagnosis not present

## 2018-06-22 DIAGNOSIS — L931 Subacute cutaneous lupus erythematosus: Secondary | ICD-10-CM | POA: Diagnosis not present

## 2018-06-22 DIAGNOSIS — Z6825 Body mass index (BMI) 25.0-25.9, adult: Secondary | ICD-10-CM | POA: Diagnosis not present

## 2018-06-22 DIAGNOSIS — M19042 Primary osteoarthritis, left hand: Secondary | ICD-10-CM | POA: Diagnosis not present

## 2018-06-22 DIAGNOSIS — Z79899 Other long term (current) drug therapy: Secondary | ICD-10-CM | POA: Diagnosis not present

## 2018-06-22 DIAGNOSIS — E663 Overweight: Secondary | ICD-10-CM | POA: Diagnosis not present

## 2018-06-22 DIAGNOSIS — M3500 Sicca syndrome, unspecified: Secondary | ICD-10-CM | POA: Diagnosis not present

## 2018-06-22 DIAGNOSIS — M19041 Primary osteoarthritis, right hand: Secondary | ICD-10-CM | POA: Diagnosis not present

## 2018-12-28 DIAGNOSIS — L931 Subacute cutaneous lupus erythematosus: Secondary | ICD-10-CM | POA: Diagnosis not present

## 2018-12-28 DIAGNOSIS — Z79899 Other long term (current) drug therapy: Secondary | ICD-10-CM | POA: Diagnosis not present

## 2018-12-28 DIAGNOSIS — M19041 Primary osteoarthritis, right hand: Secondary | ICD-10-CM | POA: Diagnosis not present

## 2018-12-28 DIAGNOSIS — M3500 Sicca syndrome, unspecified: Secondary | ICD-10-CM | POA: Diagnosis not present

## 2018-12-28 DIAGNOSIS — M19042 Primary osteoarthritis, left hand: Secondary | ICD-10-CM | POA: Diagnosis not present

## 2019-01-11 DIAGNOSIS — R69 Illness, unspecified: Secondary | ICD-10-CM | POA: Diagnosis not present

## 2019-01-23 DIAGNOSIS — Z0000 Encounter for general adult medical examination without abnormal findings: Secondary | ICD-10-CM | POA: Diagnosis not present

## 2019-01-23 DIAGNOSIS — K219 Gastro-esophageal reflux disease without esophagitis: Secondary | ICD-10-CM | POA: Diagnosis not present

## 2019-01-23 DIAGNOSIS — M329 Systemic lupus erythematosus, unspecified: Secondary | ICD-10-CM | POA: Diagnosis not present

## 2019-01-23 DIAGNOSIS — E559 Vitamin D deficiency, unspecified: Secondary | ICD-10-CM | POA: Diagnosis not present

## 2019-01-23 DIAGNOSIS — R5383 Other fatigue: Secondary | ICD-10-CM | POA: Diagnosis not present

## 2019-01-23 DIAGNOSIS — Z13220 Encounter for screening for lipoid disorders: Secondary | ICD-10-CM | POA: Diagnosis not present

## 2019-02-15 DIAGNOSIS — L820 Inflamed seborrheic keratosis: Secondary | ICD-10-CM | POA: Diagnosis not present

## 2019-02-15 DIAGNOSIS — D1801 Hemangioma of skin and subcutaneous tissue: Secondary | ICD-10-CM | POA: Diagnosis not present

## 2019-02-15 DIAGNOSIS — L814 Other melanin hyperpigmentation: Secondary | ICD-10-CM | POA: Diagnosis not present

## 2019-02-15 DIAGNOSIS — L309 Dermatitis, unspecified: Secondary | ICD-10-CM | POA: Diagnosis not present

## 2019-02-15 DIAGNOSIS — L918 Other hypertrophic disorders of the skin: Secondary | ICD-10-CM | POA: Diagnosis not present

## 2019-02-15 DIAGNOSIS — L821 Other seborrheic keratosis: Secondary | ICD-10-CM | POA: Diagnosis not present

## 2019-02-15 DIAGNOSIS — D225 Melanocytic nevi of trunk: Secondary | ICD-10-CM | POA: Diagnosis not present

## 2019-05-10 ENCOUNTER — Ambulatory Visit: Payer: Medicare HMO | Attending: Internal Medicine

## 2019-05-10 DIAGNOSIS — Z23 Encounter for immunization: Secondary | ICD-10-CM | POA: Insufficient documentation

## 2019-05-10 NOTE — Progress Notes (Signed)
   Covid-[facility] Vaccination [facility]  Name:  Janice Flynn    MRN: [id number] DOB: Dec 12, 1945  05/10/2019  Janice Flynn was observed post Covid-19 immunization for 15 minutes without incidence. She was provided with Vaccine Information Sheet and instruction to access the V-Safe system.   Janice Flynn was instructed to call 911 with any severe reactions post vaccine: Marland Kitchen Difficulty breathing  . Swelling of your face and throat  . A fast heartbeat  . A bad rash all over your body  . Dizziness and weakness    Immunizations Administered    Name Date Dose VIS Date Route   Pfizer COVID-19 Vaccine 05/10/2019  6:30 PM 0.3 mL 03/30/2019 Intramuscular   Manufacturer: [place]   Lot: [id number]   [place]: [id number]

## 2019-05-31 ENCOUNTER — Ambulatory Visit: Payer: Medicare HMO | Attending: Internal Medicine

## 2019-05-31 DIAGNOSIS — Z23 Encounter for immunization: Secondary | ICD-10-CM | POA: Insufficient documentation

## 2019-05-31 NOTE — Progress Notes (Signed)
   Covid-19 Vaccination Clinic  Name:  [name]    MRN: [id number] DOB: 16-Sep-1945  05/31/2019  [name] was observed post Covid-19 immunization for 15 minutes without incidence. She was provided with Vaccine Information Sheet and instruction to access the V-Safe system.   [name] was instructed to call 911 with any severe reactions post vaccine: Marland Kitchen Difficulty breathing  . Swelling of your face and throat  . A fast heartbeat  . A bad rash all over your body  . Dizziness and weakness    Immunizations Administered    Name Date Dose VIS Date Route   Pfizer COVID-19 Vaccine 05/31/2019  6:16 PM 0.3 mL 03/30/2019 Intramuscular   Manufacturer: [place]   Lot: [id number]   [place]: [id number]

## 2019-06-19 DIAGNOSIS — H35373 Puckering of macula, bilateral: Secondary | ICD-10-CM | POA: Diagnosis not present

## 2019-06-19 DIAGNOSIS — Z961 Presence of intraocular lens: Secondary | ICD-10-CM | POA: Diagnosis not present

## 2019-06-19 DIAGNOSIS — M3500 Sicca syndrome, unspecified: Secondary | ICD-10-CM | POA: Diagnosis not present

## 2019-06-19 DIAGNOSIS — H04123 Dry eye syndrome of bilateral lacrimal glands: Secondary | ICD-10-CM | POA: Diagnosis not present

## 2019-06-28 DIAGNOSIS — M3500 Sicca syndrome, unspecified: Secondary | ICD-10-CM | POA: Diagnosis not present

## 2019-06-28 DIAGNOSIS — Z6825 Body mass index (BMI) 25.0-25.9, adult: Secondary | ICD-10-CM | POA: Diagnosis not present

## 2019-06-28 DIAGNOSIS — M19041 Primary osteoarthritis, right hand: Secondary | ICD-10-CM | POA: Diagnosis not present

## 2019-06-28 DIAGNOSIS — L931 Subacute cutaneous lupus erythematosus: Secondary | ICD-10-CM | POA: Diagnosis not present

## 2019-06-28 DIAGNOSIS — Z79899 Other long term (current) drug therapy: Secondary | ICD-10-CM | POA: Diagnosis not present

## 2019-06-28 DIAGNOSIS — M2550 Pain in unspecified joint: Secondary | ICD-10-CM | POA: Diagnosis not present

## 2019-06-28 DIAGNOSIS — E663 Overweight: Secondary | ICD-10-CM | POA: Diagnosis not present

## 2019-06-28 DIAGNOSIS — M19042 Primary osteoarthritis, left hand: Secondary | ICD-10-CM | POA: Diagnosis not present

## 2019-07-26 DIAGNOSIS — H00021 Hordeolum internum right upper eyelid: Secondary | ICD-10-CM | POA: Diagnosis not present

## 2019-07-26 DIAGNOSIS — H00031 Abscess of right upper eyelid: Secondary | ICD-10-CM | POA: Diagnosis not present

## 2019-07-30 DIAGNOSIS — H00031 Abscess of right upper eyelid: Secondary | ICD-10-CM | POA: Diagnosis not present

## 2019-10-16 DIAGNOSIS — R69 Illness, unspecified: Secondary | ICD-10-CM | POA: Diagnosis not present

## 2019-11-07 DIAGNOSIS — M85851 Other specified disorders of bone density and structure, right thigh: Secondary | ICD-10-CM | POA: Diagnosis not present

## 2019-11-07 DIAGNOSIS — M810 Age-related osteoporosis without current pathological fracture: Secondary | ICD-10-CM | POA: Diagnosis not present

## 2019-11-07 DIAGNOSIS — Z1231 Encounter for screening mammogram for malignant neoplasm of breast: Secondary | ICD-10-CM | POA: Diagnosis not present

## 2019-11-27 DIAGNOSIS — Z7189 Other specified counseling: Secondary | ICD-10-CM | POA: Diagnosis not present

## 2019-11-27 DIAGNOSIS — M810 Age-related osteoporosis without current pathological fracture: Secondary | ICD-10-CM | POA: Diagnosis not present

## 2019-12-27 DIAGNOSIS — H43823 Vitreomacular adhesion, bilateral: Secondary | ICD-10-CM | POA: Diagnosis not present

## 2019-12-27 DIAGNOSIS — M3500 Sicca syndrome, unspecified: Secondary | ICD-10-CM | POA: Diagnosis not present

## 2019-12-27 DIAGNOSIS — H04123 Dry eye syndrome of bilateral lacrimal glands: Secondary | ICD-10-CM | POA: Diagnosis not present

## 2019-12-27 DIAGNOSIS — H35373 Puckering of macula, bilateral: Secondary | ICD-10-CM | POA: Diagnosis not present

## 2020-01-03 DIAGNOSIS — D225 Melanocytic nevi of trunk: Secondary | ICD-10-CM | POA: Diagnosis not present

## 2020-01-03 DIAGNOSIS — L821 Other seborrheic keratosis: Secondary | ICD-10-CM | POA: Diagnosis not present

## 2020-01-03 DIAGNOSIS — L814 Other melanin hyperpigmentation: Secondary | ICD-10-CM | POA: Diagnosis not present

## 2020-01-03 DIAGNOSIS — L918 Other hypertrophic disorders of the skin: Secondary | ICD-10-CM | POA: Diagnosis not present

## 2020-01-03 DIAGNOSIS — D2271 Melanocytic nevi of right lower limb, including hip: Secondary | ICD-10-CM | POA: Diagnosis not present

## 2020-01-03 DIAGNOSIS — D485 Neoplasm of uncertain behavior of skin: Secondary | ICD-10-CM | POA: Diagnosis not present

## 2020-01-03 DIAGNOSIS — D1801 Hemangioma of skin and subcutaneous tissue: Secondary | ICD-10-CM | POA: Diagnosis not present

## 2020-01-10 DIAGNOSIS — Z6825 Body mass index (BMI) 25.0-25.9, adult: Secondary | ICD-10-CM | POA: Diagnosis not present

## 2020-01-10 DIAGNOSIS — Z79899 Other long term (current) drug therapy: Secondary | ICD-10-CM | POA: Diagnosis not present

## 2020-01-10 DIAGNOSIS — M150 Primary generalized (osteo)arthritis: Secondary | ICD-10-CM | POA: Diagnosis not present

## 2020-01-10 DIAGNOSIS — E663 Overweight: Secondary | ICD-10-CM | POA: Diagnosis not present

## 2020-01-10 DIAGNOSIS — M2550 Pain in unspecified joint: Secondary | ICD-10-CM | POA: Diagnosis not present

## 2020-01-10 DIAGNOSIS — M3500 Sicca syndrome, unspecified: Secondary | ICD-10-CM | POA: Diagnosis not present

## 2020-01-10 DIAGNOSIS — L931 Subacute cutaneous lupus erythematosus: Secondary | ICD-10-CM | POA: Diagnosis not present

## 2020-01-10 DIAGNOSIS — R69 Illness, unspecified: Secondary | ICD-10-CM | POA: Diagnosis not present

## 2020-01-10 DIAGNOSIS — M810 Age-related osteoporosis without current pathological fracture: Secondary | ICD-10-CM | POA: Diagnosis not present

## 2020-01-16 DIAGNOSIS — R69 Illness, unspecified: Secondary | ICD-10-CM | POA: Diagnosis not present

## 2020-02-14 DIAGNOSIS — N952 Postmenopausal atrophic vaginitis: Secondary | ICD-10-CM | POA: Diagnosis not present

## 2020-02-14 DIAGNOSIS — R5383 Other fatigue: Secondary | ICD-10-CM | POA: Diagnosis not present

## 2020-02-14 DIAGNOSIS — Z79899 Other long term (current) drug therapy: Secondary | ICD-10-CM | POA: Diagnosis not present

## 2020-02-14 DIAGNOSIS — Z0001 Encounter for general adult medical examination with abnormal findings: Secondary | ICD-10-CM | POA: Diagnosis not present

## 2020-02-14 DIAGNOSIS — E559 Vitamin D deficiency, unspecified: Secondary | ICD-10-CM | POA: Diagnosis not present

## 2020-02-14 DIAGNOSIS — Z0000 Encounter for general adult medical examination without abnormal findings: Secondary | ICD-10-CM | POA: Diagnosis not present

## 2020-02-14 DIAGNOSIS — Z13220 Encounter for screening for lipoid disorders: Secondary | ICD-10-CM | POA: Diagnosis not present

## 2020-02-14 DIAGNOSIS — Z7982 Long term (current) use of aspirin: Secondary | ICD-10-CM | POA: Diagnosis not present

## 2020-02-14 DIAGNOSIS — Z8249 Family history of ischemic heart disease and other diseases of the circulatory system: Secondary | ICD-10-CM | POA: Diagnosis not present

## 2020-02-14 DIAGNOSIS — K219 Gastro-esophageal reflux disease without esophagitis: Secondary | ICD-10-CM | POA: Diagnosis not present

## 2020-02-14 DIAGNOSIS — M329 Systemic lupus erythematosus, unspecified: Secondary | ICD-10-CM | POA: Diagnosis not present

## 2020-06-27 DIAGNOSIS — M3500 Sicca syndrome, unspecified: Secondary | ICD-10-CM | POA: Diagnosis not present

## 2020-06-27 DIAGNOSIS — Z79899 Other long term (current) drug therapy: Secondary | ICD-10-CM | POA: Diagnosis not present

## 2020-06-27 DIAGNOSIS — H43823 Vitreomacular adhesion, bilateral: Secondary | ICD-10-CM | POA: Diagnosis not present

## 2020-06-27 DIAGNOSIS — Z961 Presence of intraocular lens: Secondary | ICD-10-CM | POA: Diagnosis not present

## 2020-06-27 DIAGNOSIS — H04123 Dry eye syndrome of bilateral lacrimal glands: Secondary | ICD-10-CM | POA: Diagnosis not present

## 2020-06-27 DIAGNOSIS — H35373 Puckering of macula, bilateral: Secondary | ICD-10-CM | POA: Diagnosis not present

## 2020-07-10 DIAGNOSIS — Z79899 Other long term (current) drug therapy: Secondary | ICD-10-CM | POA: Diagnosis not present

## 2020-07-10 DIAGNOSIS — M2550 Pain in unspecified joint: Secondary | ICD-10-CM | POA: Diagnosis not present

## 2020-07-10 DIAGNOSIS — M3500 Sicca syndrome, unspecified: Secondary | ICD-10-CM | POA: Diagnosis not present

## 2020-07-10 DIAGNOSIS — L931 Subacute cutaneous lupus erythematosus: Secondary | ICD-10-CM | POA: Diagnosis not present

## 2020-07-10 DIAGNOSIS — M150 Primary generalized (osteo)arthritis: Secondary | ICD-10-CM | POA: Diagnosis not present

## 2020-07-10 DIAGNOSIS — E663 Overweight: Secondary | ICD-10-CM | POA: Diagnosis not present

## 2020-07-10 DIAGNOSIS — Z6825 Body mass index (BMI) 25.0-25.9, adult: Secondary | ICD-10-CM | POA: Diagnosis not present

## 2021-01-13 DIAGNOSIS — E663 Overweight: Secondary | ICD-10-CM | POA: Diagnosis not present

## 2021-01-13 DIAGNOSIS — Z79899 Other long term (current) drug therapy: Secondary | ICD-10-CM | POA: Diagnosis not present

## 2021-01-13 DIAGNOSIS — M150 Primary generalized (osteo)arthritis: Secondary | ICD-10-CM | POA: Diagnosis not present

## 2021-01-13 DIAGNOSIS — L931 Subacute cutaneous lupus erythematosus: Secondary | ICD-10-CM | POA: Diagnosis not present

## 2021-01-13 DIAGNOSIS — Z6825 Body mass index (BMI) 25.0-25.9, adult: Secondary | ICD-10-CM | POA: Diagnosis not present

## 2021-01-13 DIAGNOSIS — M3500 Sicca syndrome, unspecified: Secondary | ICD-10-CM | POA: Diagnosis not present

## 2021-01-20 DIAGNOSIS — Z1231 Encounter for screening mammogram for malignant neoplasm of breast: Secondary | ICD-10-CM | POA: Diagnosis not present

## 2021-02-16 DIAGNOSIS — M329 Systemic lupus erythematosus, unspecified: Secondary | ICD-10-CM | POA: Diagnosis not present

## 2021-02-16 DIAGNOSIS — Z13220 Encounter for screening for lipoid disorders: Secondary | ICD-10-CM | POA: Diagnosis not present

## 2021-02-16 DIAGNOSIS — Z0000 Encounter for general adult medical examination without abnormal findings: Secondary | ICD-10-CM | POA: Diagnosis not present

## 2021-02-16 DIAGNOSIS — E559 Vitamin D deficiency, unspecified: Secondary | ICD-10-CM | POA: Diagnosis not present

## 2021-02-16 DIAGNOSIS — R7301 Impaired fasting glucose: Secondary | ICD-10-CM | POA: Diagnosis not present

## 2021-02-16 DIAGNOSIS — K219 Gastro-esophageal reflux disease without esophagitis: Secondary | ICD-10-CM | POA: Diagnosis not present

## 2021-02-16 DIAGNOSIS — N952 Postmenopausal atrophic vaginitis: Secondary | ICD-10-CM | POA: Diagnosis not present

## 2021-02-16 DIAGNOSIS — Z0001 Encounter for general adult medical examination with abnormal findings: Secondary | ICD-10-CM | POA: Diagnosis not present

## 2021-02-16 DIAGNOSIS — Z79899 Other long term (current) drug therapy: Secondary | ICD-10-CM | POA: Diagnosis not present

## 2021-02-16 DIAGNOSIS — R5383 Other fatigue: Secondary | ICD-10-CM | POA: Diagnosis not present

## 2021-02-16 DIAGNOSIS — R7303 Prediabetes: Secondary | ICD-10-CM | POA: Diagnosis not present

## 2021-03-26 DIAGNOSIS — D485 Neoplasm of uncertain behavior of skin: Secondary | ICD-10-CM | POA: Diagnosis not present

## 2021-03-26 DIAGNOSIS — L814 Other melanin hyperpigmentation: Secondary | ICD-10-CM | POA: Diagnosis not present

## 2021-03-26 DIAGNOSIS — L718 Other rosacea: Secondary | ICD-10-CM | POA: Diagnosis not present

## 2021-03-26 DIAGNOSIS — L570 Actinic keratosis: Secondary | ICD-10-CM | POA: Diagnosis not present

## 2021-03-26 DIAGNOSIS — L84 Corns and callosities: Secondary | ICD-10-CM | POA: Diagnosis not present

## 2021-03-26 DIAGNOSIS — D225 Melanocytic nevi of trunk: Secondary | ICD-10-CM | POA: Diagnosis not present

## 2021-03-26 DIAGNOSIS — L821 Other seborrheic keratosis: Secondary | ICD-10-CM | POA: Diagnosis not present

## 2021-03-26 DIAGNOSIS — D1801 Hemangioma of skin and subcutaneous tissue: Secondary | ICD-10-CM | POA: Diagnosis not present

## 2021-03-26 DIAGNOSIS — L812 Freckles: Secondary | ICD-10-CM | POA: Diagnosis not present

## 2021-07-13 DIAGNOSIS — M19042 Primary osteoarthritis, left hand: Secondary | ICD-10-CM | POA: Diagnosis not present

## 2021-07-13 DIAGNOSIS — L931 Subacute cutaneous lupus erythematosus: Secondary | ICD-10-CM | POA: Diagnosis not present

## 2021-07-13 DIAGNOSIS — Z6823 Body mass index (BMI) 23.0-23.9, adult: Secondary | ICD-10-CM | POA: Diagnosis not present

## 2021-07-13 DIAGNOSIS — Z79899 Other long term (current) drug therapy: Secondary | ICD-10-CM | POA: Diagnosis not present

## 2021-07-13 DIAGNOSIS — M19041 Primary osteoarthritis, right hand: Secondary | ICD-10-CM | POA: Diagnosis not present

## 2021-07-13 DIAGNOSIS — M3500 Sicca syndrome, unspecified: Secondary | ICD-10-CM | POA: Diagnosis not present

## 2021-07-13 DIAGNOSIS — L659 Nonscarring hair loss, unspecified: Secondary | ICD-10-CM | POA: Diagnosis not present

## 2021-09-18 DIAGNOSIS — H35373 Puckering of macula, bilateral: Secondary | ICD-10-CM | POA: Diagnosis not present

## 2021-09-18 DIAGNOSIS — Z961 Presence of intraocular lens: Secondary | ICD-10-CM | POA: Diagnosis not present

## 2021-09-18 DIAGNOSIS — M3501 Sicca syndrome with keratoconjunctivitis: Secondary | ICD-10-CM | POA: Diagnosis not present

## 2021-09-18 DIAGNOSIS — Z79899 Other long term (current) drug therapy: Secondary | ICD-10-CM | POA: Diagnosis not present

## 2022-01-13 DIAGNOSIS — M1991 Primary osteoarthritis, unspecified site: Secondary | ICD-10-CM | POA: Diagnosis not present

## 2022-01-13 DIAGNOSIS — L931 Subacute cutaneous lupus erythematosus: Secondary | ICD-10-CM | POA: Diagnosis not present

## 2022-01-13 DIAGNOSIS — M3501 Sicca syndrome with keratoconjunctivitis: Secondary | ICD-10-CM | POA: Diagnosis not present

## 2022-01-13 DIAGNOSIS — Z79899 Other long term (current) drug therapy: Secondary | ICD-10-CM | POA: Diagnosis not present

## 2022-01-13 DIAGNOSIS — Z6824 Body mass index (BMI) 24.0-24.9, adult: Secondary | ICD-10-CM | POA: Diagnosis not present

## 2022-01-21 DIAGNOSIS — M85852 Other specified disorders of bone density and structure, left thigh: Secondary | ICD-10-CM | POA: Diagnosis not present

## 2022-01-21 DIAGNOSIS — M85851 Other specified disorders of bone density and structure, right thigh: Secondary | ICD-10-CM | POA: Diagnosis not present

## 2022-01-21 DIAGNOSIS — Z1231 Encounter for screening mammogram for malignant neoplasm of breast: Secondary | ICD-10-CM | POA: Diagnosis not present

## 2022-01-21 DIAGNOSIS — Z780 Asymptomatic menopausal state: Secondary | ICD-10-CM | POA: Diagnosis not present

## 2022-02-16 DIAGNOSIS — Z0000 Encounter for general adult medical examination without abnormal findings: Secondary | ICD-10-CM | POA: Diagnosis not present

## 2022-02-16 DIAGNOSIS — Z7951 Long term (current) use of inhaled steroids: Secondary | ICD-10-CM | POA: Diagnosis not present

## 2022-02-16 DIAGNOSIS — Z79818 Long term (current) use of other agents affecting estrogen receptors and estrogen levels: Secondary | ICD-10-CM | POA: Diagnosis not present

## 2022-02-16 DIAGNOSIS — R5383 Other fatigue: Secondary | ICD-10-CM | POA: Diagnosis not present

## 2022-02-16 DIAGNOSIS — Z7982 Long term (current) use of aspirin: Secondary | ICD-10-CM | POA: Diagnosis not present

## 2022-02-16 DIAGNOSIS — M8580 Other specified disorders of bone density and structure, unspecified site: Secondary | ICD-10-CM | POA: Diagnosis not present

## 2022-02-16 DIAGNOSIS — K219 Gastro-esophageal reflux disease without esophagitis: Secondary | ICD-10-CM | POA: Diagnosis not present

## 2022-02-16 DIAGNOSIS — Z79899 Other long term (current) drug therapy: Secondary | ICD-10-CM | POA: Diagnosis not present

## 2022-02-16 DIAGNOSIS — M329 Systemic lupus erythematosus, unspecified: Secondary | ICD-10-CM | POA: Diagnosis not present

## 2022-02-16 DIAGNOSIS — R7301 Impaired fasting glucose: Secondary | ICD-10-CM | POA: Diagnosis not present

## 2022-02-16 DIAGNOSIS — E559 Vitamin D deficiency, unspecified: Secondary | ICD-10-CM | POA: Diagnosis not present

## 2022-02-16 DIAGNOSIS — Z13220 Encounter for screening for lipoid disorders: Secondary | ICD-10-CM | POA: Diagnosis not present

## 2022-02-25 DIAGNOSIS — D1801 Hemangioma of skin and subcutaneous tissue: Secondary | ICD-10-CM | POA: Diagnosis not present

## 2022-02-25 DIAGNOSIS — L570 Actinic keratosis: Secondary | ICD-10-CM | POA: Diagnosis not present

## 2022-02-25 DIAGNOSIS — L814 Other melanin hyperpigmentation: Secondary | ICD-10-CM | POA: Diagnosis not present

## 2022-02-25 DIAGNOSIS — L821 Other seborrheic keratosis: Secondary | ICD-10-CM | POA: Diagnosis not present

## 2022-02-25 DIAGNOSIS — L84 Corns and callosities: Secondary | ICD-10-CM | POA: Diagnosis not present

## 2022-02-25 DIAGNOSIS — L931 Subacute cutaneous lupus erythematosus: Secondary | ICD-10-CM | POA: Diagnosis not present

## 2022-07-14 DIAGNOSIS — M3500 Sicca syndrome, unspecified: Secondary | ICD-10-CM | POA: Diagnosis not present

## 2022-07-14 DIAGNOSIS — M3501 Sicca syndrome with keratoconjunctivitis: Secondary | ICD-10-CM | POA: Diagnosis not present

## 2022-07-14 DIAGNOSIS — L931 Subacute cutaneous lupus erythematosus: Secondary | ICD-10-CM | POA: Diagnosis not present

## 2022-07-14 DIAGNOSIS — M1991 Primary osteoarthritis, unspecified site: Secondary | ICD-10-CM | POA: Diagnosis not present

## 2022-07-14 DIAGNOSIS — Z79899 Other long term (current) drug therapy: Secondary | ICD-10-CM | POA: Diagnosis not present

## 2022-07-14 DIAGNOSIS — Z6824 Body mass index (BMI) 24.0-24.9, adult: Secondary | ICD-10-CM | POA: Diagnosis not present

## 2022-09-23 DIAGNOSIS — Z961 Presence of intraocular lens: Secondary | ICD-10-CM | POA: Diagnosis not present

## 2022-09-23 DIAGNOSIS — Z79899 Other long term (current) drug therapy: Secondary | ICD-10-CM | POA: Diagnosis not present

## 2022-09-23 DIAGNOSIS — H35373 Puckering of macula, bilateral: Secondary | ICD-10-CM | POA: Diagnosis not present

## 2022-09-23 DIAGNOSIS — M3501 Sicca syndrome with keratoconjunctivitis: Secondary | ICD-10-CM | POA: Diagnosis not present

## 2023-01-12 DIAGNOSIS — Z6825 Body mass index (BMI) 25.0-25.9, adult: Secondary | ICD-10-CM | POA: Diagnosis not present

## 2023-01-12 DIAGNOSIS — Z79899 Other long term (current) drug therapy: Secondary | ICD-10-CM | POA: Diagnosis not present

## 2023-01-12 DIAGNOSIS — L931 Subacute cutaneous lupus erythematosus: Secondary | ICD-10-CM | POA: Diagnosis not present

## 2023-01-12 DIAGNOSIS — M3501 Sicca syndrome with keratoconjunctivitis: Secondary | ICD-10-CM | POA: Diagnosis not present

## 2023-01-12 DIAGNOSIS — E663 Overweight: Secondary | ICD-10-CM | POA: Diagnosis not present

## 2023-01-12 DIAGNOSIS — M1991 Primary osteoarthritis, unspecified site: Secondary | ICD-10-CM | POA: Diagnosis not present

## 2023-02-22 DIAGNOSIS — E559 Vitamin D deficiency, unspecified: Secondary | ICD-10-CM | POA: Diagnosis not present

## 2023-02-22 DIAGNOSIS — K219 Gastro-esophageal reflux disease without esophagitis: Secondary | ICD-10-CM | POA: Diagnosis not present

## 2023-02-22 DIAGNOSIS — Z13220 Encounter for screening for lipoid disorders: Secondary | ICD-10-CM | POA: Diagnosis not present

## 2023-02-22 DIAGNOSIS — R5383 Other fatigue: Secondary | ICD-10-CM | POA: Diagnosis not present

## 2023-02-22 DIAGNOSIS — H9313 Tinnitus, bilateral: Secondary | ICD-10-CM | POA: Diagnosis not present

## 2023-02-22 DIAGNOSIS — R7301 Impaired fasting glucose: Secondary | ICD-10-CM | POA: Diagnosis not present

## 2023-02-22 DIAGNOSIS — M329 Systemic lupus erythematosus, unspecified: Secondary | ICD-10-CM | POA: Diagnosis not present

## 2023-02-22 DIAGNOSIS — Z0000 Encounter for general adult medical examination without abnormal findings: Secondary | ICD-10-CM | POA: Diagnosis not present

## 2023-03-01 DIAGNOSIS — L718 Other rosacea: Secondary | ICD-10-CM | POA: Diagnosis not present

## 2023-03-01 DIAGNOSIS — L931 Subacute cutaneous lupus erythematosus: Secondary | ICD-10-CM | POA: Diagnosis not present

## 2023-03-01 DIAGNOSIS — D485 Neoplasm of uncertain behavior of skin: Secondary | ICD-10-CM | POA: Diagnosis not present

## 2023-03-01 DIAGNOSIS — L570 Actinic keratosis: Secondary | ICD-10-CM | POA: Diagnosis not present

## 2023-03-01 DIAGNOSIS — L430 Hypertrophic lichen planus: Secondary | ICD-10-CM | POA: Diagnosis not present

## 2023-03-01 DIAGNOSIS — L814 Other melanin hyperpigmentation: Secondary | ICD-10-CM | POA: Diagnosis not present

## 2023-03-01 DIAGNOSIS — D225 Melanocytic nevi of trunk: Secondary | ICD-10-CM | POA: Diagnosis not present

## 2023-03-01 DIAGNOSIS — L821 Other seborrheic keratosis: Secondary | ICD-10-CM | POA: Diagnosis not present

## 2023-03-01 DIAGNOSIS — L820 Inflamed seborrheic keratosis: Secondary | ICD-10-CM | POA: Diagnosis not present

## 2023-03-01 DIAGNOSIS — D1801 Hemangioma of skin and subcutaneous tissue: Secondary | ICD-10-CM | POA: Diagnosis not present

## 2023-03-10 ENCOUNTER — Encounter: Payer: Self-pay | Admitting: Internal Medicine

## 2023-07-13 DIAGNOSIS — Z6824 Body mass index (BMI) 24.0-24.9, adult: Secondary | ICD-10-CM | POA: Diagnosis not present

## 2023-07-13 DIAGNOSIS — L931 Subacute cutaneous lupus erythematosus: Secondary | ICD-10-CM | POA: Diagnosis not present

## 2023-07-13 DIAGNOSIS — M1991 Primary osteoarthritis, unspecified site: Secondary | ICD-10-CM | POA: Diagnosis not present

## 2023-07-13 DIAGNOSIS — M3501 Sicca syndrome with keratoconjunctivitis: Secondary | ICD-10-CM | POA: Diagnosis not present

## 2023-07-13 DIAGNOSIS — Z79899 Other long term (current) drug therapy: Secondary | ICD-10-CM | POA: Diagnosis not present

## 2023-09-28 DIAGNOSIS — Z79899 Other long term (current) drug therapy: Secondary | ICD-10-CM | POA: Diagnosis not present

## 2023-09-28 DIAGNOSIS — M3501 Sicca syndrome with keratoconjunctivitis: Secondary | ICD-10-CM | POA: Diagnosis not present

## 2023-09-28 DIAGNOSIS — H35373 Puckering of macula, bilateral: Secondary | ICD-10-CM | POA: Diagnosis not present

## 2023-09-28 DIAGNOSIS — Z961 Presence of intraocular lens: Secondary | ICD-10-CM | POA: Diagnosis not present

## 2023-09-28 DIAGNOSIS — H43823 Vitreomacular adhesion, bilateral: Secondary | ICD-10-CM | POA: Diagnosis not present

## 2024-01-12 DIAGNOSIS — M3501 Sicca syndrome with keratoconjunctivitis: Secondary | ICD-10-CM | POA: Diagnosis not present

## 2024-01-12 DIAGNOSIS — L931 Subacute cutaneous lupus erythematosus: Secondary | ICD-10-CM | POA: Diagnosis not present

## 2024-01-12 DIAGNOSIS — M1991 Primary osteoarthritis, unspecified site: Secondary | ICD-10-CM | POA: Diagnosis not present

## 2024-01-12 DIAGNOSIS — Z6824 Body mass index (BMI) 24.0-24.9, adult: Secondary | ICD-10-CM | POA: Diagnosis not present

## 2024-01-12 DIAGNOSIS — Z79899 Other long term (current) drug therapy: Secondary | ICD-10-CM | POA: Diagnosis not present

## 2024-02-28 DIAGNOSIS — E559 Vitamin D deficiency, unspecified: Secondary | ICD-10-CM | POA: Diagnosis not present

## 2024-02-28 DIAGNOSIS — Z1231 Encounter for screening mammogram for malignant neoplasm of breast: Secondary | ICD-10-CM | POA: Diagnosis not present

## 2024-02-28 DIAGNOSIS — Z0000 Encounter for general adult medical examination without abnormal findings: Secondary | ICD-10-CM | POA: Diagnosis not present

## 2024-02-28 DIAGNOSIS — Z13220 Encounter for screening for lipoid disorders: Secondary | ICD-10-CM | POA: Diagnosis not present

## 2024-02-28 DIAGNOSIS — R5383 Other fatigue: Secondary | ICD-10-CM | POA: Diagnosis not present

## 2024-02-28 DIAGNOSIS — R7301 Impaired fasting glucose: Secondary | ICD-10-CM | POA: Diagnosis not present

## 2024-02-28 DIAGNOSIS — K219 Gastro-esophageal reflux disease without esophagitis: Secondary | ICD-10-CM | POA: Diagnosis not present

## 2024-02-28 DIAGNOSIS — Z13820 Encounter for screening for osteoporosis: Secondary | ICD-10-CM | POA: Diagnosis not present

## 2024-02-28 DIAGNOSIS — F5101 Primary insomnia: Secondary | ICD-10-CM | POA: Diagnosis not present

## 2024-02-28 DIAGNOSIS — Z780 Asymptomatic menopausal state: Secondary | ICD-10-CM | POA: Diagnosis not present

## 2024-02-28 DIAGNOSIS — R350 Frequency of micturition: Secondary | ICD-10-CM | POA: Diagnosis not present

## 2024-02-28 DIAGNOSIS — M329 Systemic lupus erythematosus, unspecified: Secondary | ICD-10-CM | POA: Diagnosis not present

## 2024-04-03 DIAGNOSIS — L570 Actinic keratosis: Secondary | ICD-10-CM | POA: Diagnosis not present

## 2024-04-03 DIAGNOSIS — L821 Other seborrheic keratosis: Secondary | ICD-10-CM | POA: Diagnosis not present

## 2024-04-03 DIAGNOSIS — L718 Other rosacea: Secondary | ICD-10-CM | POA: Diagnosis not present

## 2024-04-03 DIAGNOSIS — L918 Other hypertrophic disorders of the skin: Secondary | ICD-10-CM | POA: Diagnosis not present

## 2024-04-03 DIAGNOSIS — L931 Subacute cutaneous lupus erythematosus: Secondary | ICD-10-CM | POA: Diagnosis not present

## 2024-04-03 DIAGNOSIS — D485 Neoplasm of uncertain behavior of skin: Secondary | ICD-10-CM | POA: Diagnosis not present

## 2024-04-03 DIAGNOSIS — L080 Pyoderma: Secondary | ICD-10-CM | POA: Diagnosis not present
